# Patient Record
Sex: Male | Born: 1970 | Race: White | Hispanic: No | Marital: Single | Smoking: Former smoker
Health system: Southern US, Community
[De-identification: ages and names within clinical notes are randomized; demographics above are authoritative.]

## PROBLEM LIST (undated history)

## (undated) DIAGNOSIS — N189 Chronic kidney disease, unspecified: Secondary | ICD-10-CM

## (undated) DIAGNOSIS — E119 Type 2 diabetes mellitus without complications: Secondary | ICD-10-CM

## (undated) DIAGNOSIS — I219 Acute myocardial infarction, unspecified: Secondary | ICD-10-CM

## (undated) DIAGNOSIS — I2510 Atherosclerotic heart disease of native coronary artery without angina pectoris: Secondary | ICD-10-CM

## (undated) DIAGNOSIS — I639 Cerebral infarction, unspecified: Secondary | ICD-10-CM

## (undated) DIAGNOSIS — I10 Essential (primary) hypertension: Secondary | ICD-10-CM

## (undated) DIAGNOSIS — E785 Hyperlipidemia, unspecified: Secondary | ICD-10-CM

## (undated) HISTORY — PX: APPENDECTOMY: SHX54

## (undated) HISTORY — DX: Chronic kidney disease, unspecified: N18.9

## (undated) HISTORY — DX: Acute myocardial infarction, unspecified: I21.9

## (undated) HISTORY — PX: HERNIA REPAIR: SHX51

## (undated) HISTORY — DX: Atherosclerotic heart disease of native coronary artery without angina pectoris: I25.10

## (undated) HISTORY — DX: Hyperlipidemia, unspecified: E78.5

---

## 2012-05-17 DIAGNOSIS — F32A Depression, unspecified: Secondary | ICD-10-CM | POA: Insufficient documentation

## 2012-05-17 DIAGNOSIS — J984 Other disorders of lung: Secondary | ICD-10-CM | POA: Insufficient documentation

## 2012-05-17 DIAGNOSIS — F17200 Nicotine dependence, unspecified, uncomplicated: Secondary | ICD-10-CM | POA: Insufficient documentation

## 2013-08-31 ENCOUNTER — Emergency Department: Payer: Self-pay | Admitting: Emergency Medicine

## 2013-08-31 LAB — URINALYSIS, COMPLETE
BLOOD: NEGATIVE
Bacteria: NONE SEEN
Bilirubin,UR: NEGATIVE
Glucose,UR: >=500 mg/dL (ref 0–75)
Ketone: NEGATIVE
Leukocyte Esterase: NEGATIVE
Nitrite: NEGATIVE
PH: 5 (ref 4.5–8.0)
Protein: NEGATIVE
SQUAMOUS EPITHELIAL: NONE SEEN
Specific Gravity: 1.026 (ref 1.003–1.030)

## 2013-08-31 LAB — COMPREHENSIVE METABOLIC PANEL
ALK PHOS: 81 U/L
Albumin: 4.1 g/dL (ref 3.4–5.0)
Anion Gap: 3 — ABNORMAL LOW (ref 7–16)
BUN: 18 mg/dL (ref 7–18)
Bilirubin,Total: 0.2 mg/dL (ref 0.2–1.0)
CHLORIDE: 105 mmol/L (ref 98–107)
CO2: 30 mmol/L (ref 21–32)
Calcium, Total: 9.2 mg/dL (ref 8.5–10.1)
Creatinine: 0.77 mg/dL (ref 0.60–1.30)
EGFR (African American): >60
GLUCOSE: 223 mg/dL — AB (ref 65–99)
Osmolality: 284 (ref 275–301)
POTASSIUM: 4.4 mmol/L (ref 3.5–5.1)
SGOT(AST): 27 U/L (ref 15–37)
SGPT (ALT): 46 U/L (ref 12–78)
SODIUM: 138 mmol/L (ref 136–145)
Total Protein: 7.6 g/dL (ref 6.4–8.2)

## 2013-08-31 LAB — CBC WITH DIFFERENTIAL/PLATELET
BASOS PCT: 1.3 %
Basophil #: 0.1 10*3/uL (ref 0.0–0.1)
EOS ABS: 0.2 10*3/uL (ref 0.0–0.7)
Eosinophil %: 2.1 %
HCT: 44 % (ref 40.0–52.0)
HGB: 14.5 g/dL (ref 13.0–18.0)
Lymphocyte #: 2.5 10*3/uL (ref 1.0–3.6)
Lymphocyte %: 32.3 %
MCH: 30.7 pg (ref 26.0–34.0)
MCHC: 33.1 g/dL (ref 32.0–36.0)
MCV: 93 fL (ref 80–100)
MONO ABS: 0.5 x10 3/mm (ref 0.2–1.0)
MONOS PCT: 5.9 %
Neutrophil #: 4.6 10*3/uL (ref 1.4–6.5)
Neutrophil %: 58.4 %
PLATELETS: 196 10*3/uL (ref 150–440)
RBC: 4.73 10*6/uL (ref 4.40–5.90)
RDW: 13.3 % (ref 11.5–14.5)
WBC: 7.9 10*3/uL (ref 3.8–10.6)

## 2013-08-31 LAB — LIPASE, BLOOD: Lipase: 162 U/L (ref 73–393)

## 2014-10-22 DIAGNOSIS — I10 Essential (primary) hypertension: Secondary | ICD-10-CM | POA: Insufficient documentation

## 2014-10-22 DIAGNOSIS — E7800 Pure hypercholesterolemia, unspecified: Secondary | ICD-10-CM | POA: Insufficient documentation

## 2014-10-22 DIAGNOSIS — Z8673 Personal history of transient ischemic attack (TIA), and cerebral infarction without residual deficits: Secondary | ICD-10-CM | POA: Insufficient documentation

## 2015-04-30 ENCOUNTER — Other Ambulatory Visit: Payer: Self-pay | Admitting: Neurology

## 2015-04-30 DIAGNOSIS — M79602 Pain in left arm: Secondary | ICD-10-CM

## 2015-04-30 DIAGNOSIS — Z8673 Personal history of transient ischemic attack (TIA), and cerebral infarction without residual deficits: Secondary | ICD-10-CM

## 2015-04-30 DIAGNOSIS — M542 Cervicalgia: Secondary | ICD-10-CM

## 2015-05-17 ENCOUNTER — Ambulatory Visit
Admission: RE | Admit: 2015-05-17 | Discharge: 2015-05-17 | Disposition: A | Discharge status: Home / Self Care | Payer: BLUE CROSS/BLUE SHIELD | Source: Ambulatory Visit | Attending: Neurology | Admitting: Neurology

## 2015-05-17 DIAGNOSIS — M50323 Other cervical disc degeneration at C6-C7 level: Secondary | ICD-10-CM | POA: Diagnosis not present

## 2015-05-17 DIAGNOSIS — M542 Cervicalgia: Secondary | ICD-10-CM

## 2015-05-17 DIAGNOSIS — M79602 Pain in left arm: Secondary | ICD-10-CM | POA: Insufficient documentation

## 2015-05-17 DIAGNOSIS — M4802 Spinal stenosis, cervical region: Secondary | ICD-10-CM | POA: Insufficient documentation

## 2015-05-17 DIAGNOSIS — Z8673 Personal history of transient ischemic attack (TIA), and cerebral infarction without residual deficits: Secondary | ICD-10-CM | POA: Diagnosis present

## 2015-05-17 MED ORDER — GADOBENATE DIMEGLUMINE 529 MG/ML IV SOLN
20.0000 mL | Freq: Once | INTRAVENOUS | Status: AC | PRN
  Administered 2015-05-17: 17 mL via INTRAVENOUS

## 2015-12-24 ENCOUNTER — Emergency Department
Admission: EM | Admit: 2015-12-24 | Discharge: 2015-12-24 | Disposition: A | Discharge status: Home / Self Care | Payer: BLUE CROSS/BLUE SHIELD | Attending: Emergency Medicine | Admitting: Emergency Medicine

## 2015-12-24 ENCOUNTER — Emergency Department: Payer: BLUE CROSS/BLUE SHIELD

## 2015-12-24 DIAGNOSIS — S20212A Contusion of left front wall of thorax, initial encounter: Secondary | ICD-10-CM | POA: Insufficient documentation

## 2015-12-24 DIAGNOSIS — I10 Essential (primary) hypertension: Secondary | ICD-10-CM | POA: Diagnosis not present

## 2015-12-24 DIAGNOSIS — Y939 Activity, unspecified: Secondary | ICD-10-CM | POA: Insufficient documentation

## 2015-12-24 DIAGNOSIS — E119 Type 2 diabetes mellitus without complications: Secondary | ICD-10-CM | POA: Insufficient documentation

## 2015-12-24 DIAGNOSIS — Z7984 Long term (current) use of oral hypoglycemic drugs: Secondary | ICD-10-CM | POA: Diagnosis not present

## 2015-12-24 DIAGNOSIS — X501XXA Overexertion from prolonged static or awkward postures, initial encounter: Secondary | ICD-10-CM | POA: Insufficient documentation

## 2015-12-24 DIAGNOSIS — F17210 Nicotine dependence, cigarettes, uncomplicated: Secondary | ICD-10-CM | POA: Diagnosis not present

## 2015-12-24 DIAGNOSIS — Y929 Unspecified place or not applicable: Secondary | ICD-10-CM | POA: Insufficient documentation

## 2015-12-24 DIAGNOSIS — S299XXA Unspecified injury of thorax, initial encounter: Secondary | ICD-10-CM | POA: Diagnosis present

## 2015-12-24 DIAGNOSIS — Z79899 Other long term (current) drug therapy: Secondary | ICD-10-CM | POA: Insufficient documentation

## 2015-12-24 DIAGNOSIS — Z8673 Personal history of transient ischemic attack (TIA), and cerebral infarction without residual deficits: Secondary | ICD-10-CM | POA: Diagnosis not present

## 2015-12-24 DIAGNOSIS — Y999 Unspecified external cause status: Secondary | ICD-10-CM | POA: Insufficient documentation

## 2015-12-24 HISTORY — DX: Cerebral infarction, unspecified: I63.9

## 2015-12-24 HISTORY — DX: Essential (primary) hypertension: I10

## 2015-12-24 HISTORY — DX: Type 2 diabetes mellitus without complications: E11.9

## 2015-12-24 MED ORDER — IBUPROFEN 600 MG PO TABS: 600.0000 mg | ORAL_TABLET | Freq: Three times a day (TID) | ORAL | 0 refills | Status: DC | PRN

## 2015-12-24 MED ORDER — TRAMADOL HCL 50 MG PO TABS: 50.0000 mg | ORAL_TABLET | Freq: Four times a day (QID) | ORAL | 0 refills | Status: AC | PRN

## 2015-12-24 MED ORDER — METHOCARBAMOL 750 MG PO TABS: 750.0000 mg | ORAL_TABLET | Freq: Four times a day (QID) | ORAL | 0 refills | Status: DC

## 2015-12-24 MED ORDER — KETOROLAC TROMETHAMINE 60 MG/2ML IM SOLN
60.0000 mg | Freq: Once | INTRAMUSCULAR | Status: AC
  Administered 2015-12-24: 60 mg via INTRAMUSCULAR
  Filled 2015-12-24: qty 2
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
## 2017-06-02 ENCOUNTER — Other Ambulatory Visit: Payer: Self-pay

## 2017-06-02 ENCOUNTER — Emergency Department: Payer: BLUE CROSS/BLUE SHIELD

## 2017-06-02 ENCOUNTER — Encounter: Payer: Self-pay | Admitting: Emergency Medicine

## 2017-06-02 ENCOUNTER — Inpatient Hospital Stay
Admission: EM | Admit: 2017-06-02 | Discharge: 2017-06-03 | DRG: 247 | Disposition: A | Discharge status: Home / Self Care | Payer: BLUE CROSS/BLUE SHIELD | Attending: Internal Medicine | Admitting: Internal Medicine

## 2017-06-02 ENCOUNTER — Encounter
Admission: EM | Disposition: A | Discharge status: Home / Self Care | Payer: Self-pay | Source: Home / Self Care | Attending: Internal Medicine

## 2017-06-02 DIAGNOSIS — Z79899 Other long term (current) drug therapy: Secondary | ICD-10-CM

## 2017-06-02 DIAGNOSIS — Z7902 Long term (current) use of antithrombotics/antiplatelets: Secondary | ICD-10-CM

## 2017-06-02 DIAGNOSIS — Z7984 Long term (current) use of oral hypoglycemic drugs: Secondary | ICD-10-CM | POA: Diagnosis not present

## 2017-06-02 DIAGNOSIS — Z8673 Personal history of transient ischemic attack (TIA), and cerebral infarction without residual deficits: Secondary | ICD-10-CM | POA: Diagnosis not present

## 2017-06-02 DIAGNOSIS — Z791 Long term (current) use of non-steroidal anti-inflammatories (NSAID): Secondary | ICD-10-CM | POA: Diagnosis not present

## 2017-06-02 DIAGNOSIS — Z7982 Long term (current) use of aspirin: Secondary | ICD-10-CM | POA: Diagnosis not present

## 2017-06-02 DIAGNOSIS — E119 Type 2 diabetes mellitus without complications: Secondary | ICD-10-CM | POA: Diagnosis present

## 2017-06-02 DIAGNOSIS — Z87891 Personal history of nicotine dependence: Secondary | ICD-10-CM

## 2017-06-02 DIAGNOSIS — I214 Non-ST elevation (NSTEMI) myocardial infarction: Secondary | ICD-10-CM | POA: Diagnosis present

## 2017-06-02 DIAGNOSIS — I10 Essential (primary) hypertension: Secondary | ICD-10-CM | POA: Diagnosis present

## 2017-06-02 DIAGNOSIS — E785 Hyperlipidemia, unspecified: Secondary | ICD-10-CM | POA: Diagnosis present

## 2017-06-02 DIAGNOSIS — Z888 Allergy status to other drugs, medicaments and biological substances status: Secondary | ICD-10-CM

## 2017-06-02 HISTORY — PX: CORONARY STENT INTERVENTION: CATH118234

## 2017-06-02 HISTORY — PX: LEFT HEART CATH AND CORONARY ANGIOGRAPHY: CATH118249

## 2017-06-02 LAB — GLUCOSE, CAPILLARY
GLUCOSE-CAPILLARY: 211 mg/dL — AB (ref 65–99)
Glucose-Capillary: 195 mg/dL — ABNORMAL HIGH (ref 65–99)
Glucose-Capillary: 161 mg/dL — ABNORMAL HIGH (ref 65–99)
Glucose-Capillary: 264 mg/dL — ABNORMAL HIGH (ref 65–99)

## 2017-06-02 LAB — CBC
HCT: 44.9 % (ref 40.0–52.0)
Hemoglobin: 15 g/dL (ref 13.0–18.0)
MCH: 30.6 pg (ref 26.0–34.0)
MCHC: 33.3 g/dL (ref 32.0–36.0)
MCV: 91.7 fL (ref 80.0–100.0)
PLATELETS: 185 10*3/uL (ref 150–440)
RBC: 4.9 MIL/uL (ref 4.40–5.90)
RDW: 13.1 % (ref 11.5–14.5)
WBC: 7.4 10*3/uL (ref 3.8–10.6)

## 2017-06-02 LAB — BASIC METABOLIC PANEL
Anion gap: 11 (ref 5–15)
BUN: 19 mg/dL (ref 6–20)
CALCIUM: 9.7 mg/dL (ref 8.9–10.3)
CHLORIDE: 102 mmol/L (ref 101–111)
CO2: 24 mmol/L (ref 22–32)
CREATININE: 0.7 mg/dL (ref 0.61–1.24)
Glucose, Bld: 255 mg/dL — ABNORMAL HIGH (ref 65–99)
Potassium: 4.5 mmol/L (ref 3.5–5.1)
SODIUM: 137 mmol/L (ref 135–145)

## 2017-06-02 LAB — CARDIAC CATHETERIZATION: Cath EF Quantitative: 50 %

## 2017-06-02 LAB — TROPONIN I
TROPONIN I: 5.33 ng/mL — AB (ref <0.03)
Troponin I: 1.48 ng/mL (ref <0.03)

## 2017-06-02 LAB — POCT ACTIVATED CLOTTING TIME: ACTIVATED CLOTTING TIME: 346 s

## 2017-06-02 LAB — APTT: aPTT: 43 seconds — ABNORMAL HIGH (ref 24–36)

## 2017-06-02 SURGERY — LEFT HEART CATH AND CORONARY ANGIOGRAPHY
Anesthesia: Moderate Sedation

## 2017-06-02 MED ORDER — INSULIN ASPART 100 UNIT/ML ~~LOC~~ SOLN
0.0000 [IU] | Freq: Three times a day (TID) | SUBCUTANEOUS | Status: DC
  Administered 2017-06-02: 5 [IU] via SUBCUTANEOUS
  Administered 2017-06-03: 8 [IU] via SUBCUTANEOUS
  Filled 2017-06-02 (×2): qty 1

## 2017-06-02 MED ORDER — ACETAMINOPHEN 325 MG PO TABS: 650.0000 mg | ORAL_TABLET | ORAL | Status: DC | PRN

## 2017-06-02 MED ORDER — ACETAMINOPHEN 650 MG RE SUPP
650.0000 mg | Freq: Four times a day (QID) | RECTAL | Status: DC | PRN
Start: 2017-06-02 — End: 2017-06-03

## 2017-06-02 MED ORDER — HYDRALAZINE HCL 20 MG/ML IJ SOLN: 5.0000 mg | INTRAMUSCULAR | Status: AC | PRN

## 2017-06-02 MED ORDER — SODIUM CHLORIDE 0.9 % IV SOLN: INTRAVENOUS | Status: DC

## 2017-06-02 MED ORDER — ASPIRIN 81 MG PO CHEW
81.0000 mg | CHEWABLE_TABLET | Freq: Every day | ORAL | Status: DC
  Administered 2017-06-02 – 2017-06-03 (×2): 81 mg via ORAL
  Filled 2017-06-02 (×2): qty 1

## 2017-06-02 MED ORDER — CLOPIDOGREL BISULFATE 75 MG PO TABS
75.0000 mg | ORAL_TABLET | Freq: Every day | ORAL | Status: DC
  Administered 2017-06-03: 75 mg via ORAL
  Filled 2017-06-02: qty 1

## 2017-06-02 MED ORDER — MIDAZOLAM HCL 2 MG/2ML IJ SOLN
INTRAMUSCULAR | Status: AC
  Filled 2017-06-02: qty 2

## 2017-06-02 MED ORDER — ASPIRIN 81 MG PO CHEW
243.0000 mg | CHEWABLE_TABLET | Freq: Once | ORAL | Status: AC
  Administered 2017-06-02: 243 mg via ORAL
  Filled 2017-06-02: qty 3

## 2017-06-02 MED ORDER — SODIUM CHLORIDE 0.9 % WEIGHT BASED INFUSION: 1.0000 mL/kg/h | INTRAVENOUS | Status: DC

## 2017-06-02 MED ORDER — ONDANSETRON HCL 4 MG PO TABS: 4.0000 mg | ORAL_TABLET | Freq: Four times a day (QID) | ORAL | Status: DC | PRN

## 2017-06-02 MED ORDER — CLOPIDOGREL BISULFATE 75 MG PO TABS
ORAL_TABLET | ORAL | Status: DC | PRN
  Administered 2017-06-02: 300 mg via ORAL

## 2017-06-02 MED ORDER — METOPROLOL TARTRATE 25 MG PO TABS
25.0000 mg | ORAL_TABLET | Freq: Two times a day (BID) | ORAL | Status: DC
  Administered 2017-06-02 – 2017-06-03 (×3): 25 mg via ORAL
  Filled 2017-06-02 (×2): qty 1

## 2017-06-02 MED ORDER — FENTANYL CITRATE (PF) 100 MCG/2ML IJ SOLN
INTRAMUSCULAR | Status: AC
Start: 2017-06-02 — End: ?
  Filled 2017-06-02: qty 2

## 2017-06-02 MED ORDER — FENTANYL CITRATE (PF) 100 MCG/2ML IJ SOLN
INTRAMUSCULAR | Status: DC | PRN
  Administered 2017-06-02: 25 ug via INTRAVENOUS

## 2017-06-02 MED ORDER — DOCUSATE SODIUM 100 MG PO CAPS
100.0000 mg | ORAL_CAPSULE | Freq: Two times a day (BID) | ORAL | Status: DC
  Administered 2017-06-02 – 2017-06-03 (×2): 100 mg via ORAL
  Filled 2017-06-02 (×2): qty 1

## 2017-06-02 MED ORDER — NITROGLYCERIN 2 % TD OINT: 0.5000 [in_us] | TOPICAL_OINTMENT | Freq: Four times a day (QID) | TRANSDERMAL | Status: DC

## 2017-06-02 MED ORDER — SODIUM CHLORIDE 0.9 % IV SOLN
0.2500 mg/kg/h | INTRAVENOUS | Status: DC
  Filled 2017-06-02: qty 250

## 2017-06-02 MED ORDER — SODIUM CHLORIDE 0.9% FLUSH: 3.0000 mL | Freq: Two times a day (BID) | INTRAVENOUS | Status: DC

## 2017-06-02 MED ORDER — OXYCODONE-ACETAMINOPHEN 5-325 MG PO TABS: 1.0000 | ORAL_TABLET | ORAL | Status: DC | PRN

## 2017-06-02 MED ORDER — SODIUM CHLORIDE 0.9 % WEIGHT BASED INFUSION: 3.0000 mL/kg/h | INTRAVENOUS | Status: AC

## 2017-06-02 MED ORDER — METOPROLOL TARTRATE 25 MG PO TABS
ORAL_TABLET | ORAL | Status: AC
  Administered 2017-06-02: 25 mg via ORAL
  Filled 2017-06-02: qty 1

## 2017-06-02 MED ORDER — NITROGLYCERIN 5 MG/ML IV SOLN
INTRAVENOUS | Status: AC
  Filled 2017-06-02: qty 10

## 2017-06-02 MED ORDER — NITROGLYCERIN 2 % TD OINT
1.0000 [in_us] | TOPICAL_OINTMENT | Freq: Once | TRANSDERMAL | Status: AC
  Administered 2017-06-02: 1 [in_us] via TOPICAL

## 2017-06-02 MED ORDER — ASPIRIN EC 81 MG PO TBEC: 81.0000 mg | DELAYED_RELEASE_TABLET | Freq: Every day | ORAL | Status: DC

## 2017-06-02 MED ORDER — BIVALIRUDIN TRIFLUOROACETATE 250 MG IV SOLR
INTRAVENOUS | Status: AC
  Filled 2017-06-02: qty 250

## 2017-06-02 MED ORDER — LOSARTAN POTASSIUM 25 MG PO TABS
25.0000 mg | ORAL_TABLET | Freq: Every day | ORAL | Status: DC
  Administered 2017-06-03: 25 mg via ORAL
  Filled 2017-06-02: qty 1

## 2017-06-02 MED ORDER — LABETALOL HCL 5 MG/ML IV SOLN: 10.0000 mg | INTRAVENOUS | Status: AC | PRN

## 2017-06-02 MED ORDER — NITROGLYCERIN 2 % TD OINT
TOPICAL_OINTMENT | TRANSDERMAL | Status: AC
  Administered 2017-06-02: 1 [in_us] via TOPICAL
  Filled 2017-06-02: qty 1

## 2017-06-02 MED ORDER — SODIUM CHLORIDE 0.9% FLUSH
3.0000 mL | Freq: Two times a day (BID) | INTRAVENOUS | Status: DC
  Administered 2017-06-02 – 2017-06-03 (×2): 3 mL via INTRAVENOUS

## 2017-06-02 MED ORDER — BIVALIRUDIN BOLUS VIA INFUSION - CUPID
INTRAVENOUS | Status: DC | PRN
  Administered 2017-06-02: 63.3 mg via INTRAVENOUS

## 2017-06-02 MED ORDER — SODIUM CHLORIDE 0.9% FLUSH: 3.0000 mL | INTRAVENOUS | Status: DC | PRN

## 2017-06-02 MED ORDER — ASPIRIN 81 MG PO CHEW: 81.0000 mg | CHEWABLE_TABLET | ORAL | Status: DC

## 2017-06-02 MED ORDER — SODIUM CHLORIDE 0.9% FLUSH
3.0000 mL | INTRAVENOUS | Status: DC | PRN
Start: 2017-06-02 — End: 2017-06-02

## 2017-06-02 MED ORDER — IOPAMIDOL (ISOVUE-300) INJECTION 61%
INTRAVENOUS | Status: DC | PRN
  Administered 2017-06-02: 215 mL via INTRA_ARTERIAL

## 2017-06-02 MED ORDER — ONDANSETRON HCL 4 MG/2ML IJ SOLN: 4.0000 mg | Freq: Four times a day (QID) | INTRAMUSCULAR | Status: DC | PRN

## 2017-06-02 MED ORDER — CLOPIDOGREL BISULFATE 300 MG PO TABS
ORAL_TABLET | ORAL | Status: AC
  Filled 2017-06-02: qty 1

## 2017-06-02 MED ORDER — ACETAMINOPHEN 325 MG PO TABS
650.0000 mg | ORAL_TABLET | Freq: Four times a day (QID) | ORAL | Status: DC | PRN
  Administered 2017-06-02: 650 mg via ORAL
  Filled 2017-06-02: qty 2

## 2017-06-02 MED ORDER — GABAPENTIN 300 MG PO CAPS
300.0000 mg | ORAL_CAPSULE | Freq: Three times a day (TID) | ORAL | Status: DC
  Administered 2017-06-02 – 2017-06-03 (×3): 300 mg via ORAL
  Filled 2017-06-02 (×3): qty 1

## 2017-06-02 MED ORDER — SODIUM CHLORIDE 0.9 % IV SOLN
INTRAVENOUS | Status: AC | PRN
  Administered 2017-06-02: 1.75 mg/kg/h via INTRAVENOUS

## 2017-06-02 MED ORDER — MIDAZOLAM HCL 2 MG/2ML IJ SOLN
INTRAMUSCULAR | Status: DC | PRN
  Administered 2017-06-02: 1 mg via INTRAVENOUS

## 2017-06-02 MED ORDER — BISACODYL 5 MG PO TBEC: 5.0000 mg | DELAYED_RELEASE_TABLET | Freq: Every day | ORAL | Status: DC | PRN

## 2017-06-02 MED ORDER — HEPARIN BOLUS VIA INFUSION
4000.0000 [IU] | Freq: Once | INTRAVENOUS | Status: AC
  Administered 2017-06-02: 4000 [IU] via INTRAVENOUS
  Filled 2017-06-02: qty 4000

## 2017-06-02 MED ORDER — SODIUM CHLORIDE 0.9 % IV SOLN
250.0000 mL | INTRAVENOUS | Status: DC | PRN
Start: 2017-06-02 — End: 2017-06-02

## 2017-06-02 MED ORDER — SODIUM CHLORIDE 0.9 % WEIGHT BASED INFUSION
1.0000 mL/kg/h | INTRAVENOUS | Status: DC
  Administered 2017-06-02: 1 mL/kg/h via INTRAVENOUS

## 2017-06-02 MED ORDER — SODIUM CHLORIDE 0.9 % IV SOLN: 250.0000 mL | INTRAVENOUS | Status: DC | PRN

## 2017-06-02 MED ORDER — SIMVASTATIN 20 MG PO TABS
20.0000 mg | ORAL_TABLET | Freq: Every day | ORAL | Status: DC
  Administered 2017-06-02: 20 mg via ORAL
  Filled 2017-06-02: qty 1

## 2017-06-02 MED ORDER — HEPARIN (PORCINE) IN NACL 100-0.45 UNIT/ML-% IJ SOLN
1000.0000 [IU]/h | INTRAMUSCULAR | Status: DC
  Administered 2017-06-02: 1000 [IU]/h via INTRAVENOUS
  Filled 2017-06-02: qty 250

## 2017-06-02 SURGICAL SUPPLY — 18 items
BALLN TREK RX 3.0X12 (BALLOONS) ×3
BALLN ~~LOC~~ TREK RX 3.25X12 (BALLOONS) ×3
BALLOON TREK RX 3.0X12 (BALLOONS) ×1 IMPLANT
BALLOON ~~LOC~~ TREK RX 3.25X12 (BALLOONS) ×1 IMPLANT
CATH INFINITI 5FR ANG PIGTAIL (CATHETERS) ×3 IMPLANT
CATH INFINITI 5FR JL4 (CATHETERS) ×3 IMPLANT
CATH INFINITI JR4 5F (CATHETERS) ×3 IMPLANT
CATH VISTA GUIDE 6FR JR4 SH (CATHETERS) ×3 IMPLANT
DEVICE CLOSURE MYNXGRIP 6/7F (Vascular Products) ×3 IMPLANT
DEVICE INFLAT 30 PLUS (MISCELLANEOUS) ×3 IMPLANT
KIT MANI 3VAL PERCEP (MISCELLANEOUS) ×3 IMPLANT
NEEDLE PERC 18GX7CM (NEEDLE) ×3 IMPLANT
PACK CARDIAC CATH (CUSTOM PROCEDURE TRAY) ×3 IMPLANT
SHEATH AVANTI 5FR X 11CM (SHEATH) ×3 IMPLANT
SHEATH AVANTI 6FR X 11CM (SHEATH) ×3 IMPLANT
STENT SIERRA 3.00 X 18 MM (Permanent Stent) ×3 IMPLANT
WIRE EMERALD 3MM-J .035X150CM (WIRE) ×3 IMPLANT
WIRE G HI TQ BMW 190 (WIRE) ×3 IMPLANT

## 2017-06-02 NOTE — ED Provider Notes (Signed)
[facility] Emergency Department Provider Note ____________________________________________   I have reviewed the triage vital signs and the triage nursing note.  HISTORY  Chief Complaint Chest Pain   Historian Patient  HPI Peter Galloway is a 47 y.o. male with a history of prior hypertension and stroke and diabetes, on metformin, presents to the ED for "indigestion" which is been intermittent but mostly ongoing since at least Friday, approximately 5 days now.  States nausea with some vomiting, nonbloody nonbilious especially on Saturday.  No shortness of breath.  Occasionally he has pain that goes to both arms.  Currently he is not having any significant chest discomfort, but he states he has some soreness in both arms.  No fever or coughing.  At times symptoms were very severe, he states that he is stubborn and asked why he did not come initially or any of the last 4 days.   Past Medical History:  Diagnosis Date  . Diabetes mellitus without complication (HCC)   . Hypertension   . Stroke [facility](HCC)     There are no active problems to display for this patient.   Past Surgical History:  Procedure Laterality Date  . APPENDECTOMY    . HERNIA REPAIR      Prior to Admission medications   Medication Sig Start Date End Date Taking? Authorizing Provider  clopidogrel (PLAVIX) 75 MG tablet Take 75 mg by mouth daily.    [provider]  glipiZIDE (GLUCOTROL) 10 MG tablet Take 10 mg by mouth daily before breakfast.    [provider]  ibuprofen (ADVIL,MOTRIN) 600 MG tablet Take 1 tablet (600 mg total) by mouth every 8 (eight) hours as needed. 12/24/15   Joni ReiningSmith, Ronald K, PA-C  losartan (COZAAR) 25 MG tablet Take 25 mg by mouth daily.    [provider]  metFORMIN (GLUCOPHAGE) 500 MG tablet Take 500 mg by mouth 2 (two) times daily with a meal.    [provider]  methocarbamol (ROBAXIN-750) 750 MG tablet Take 1 tablet (750 mg total) by  mouth 4 (four) times daily. 12/24/15   Joni ReiningSmith, Ronald K, PA-C  simvastatin (ZOCOR) 20 MG tablet Take 20 mg by mouth daily.    [provider]    No Known Allergies  History reviewed. No pertinent family history.  Social History Social History   Tobacco Use  . Smoking status: Former Smoker    Types: Cigarettes  . Smokeless tobacco: Never Used  Substance Use Topics  . Alcohol use: No  . Drug use: No    Review of Systems  Constitutional: Negative for fever. Eyes: Negative for visual changes. ENT: Negative for sore throat. Cardiovascular: Positive for chest pain. Respiratory: Negative for shortness of breath. Gastrointestinal: Negative for abdominal pain, vomiting and diarrhea. Genitourinary: Negative for dysuria. Musculoskeletal: Negative for back pain. Skin: Negative for rash. Neurological: Negative for headache.  ____________________________________________   PHYSICAL EXAM:  VITAL SIGNS: ED Triage Vitals  Enc Vitals Group     BP 06/02/17 0943 (!) 168/100     Pulse Rate 06/02/17 0943 95     Resp 06/02/17 0943 18     Temp 06/02/17 0943 98.2 F (36.8 C)     Temp Source 06/02/17 0943 Oral     SpO2 06/02/17 0943 98 %     Weight 06/02/17 0943 186 lb (84.4 kg)     Height 06/02/17 0943 5\' 10"  (1.778 m)     Head Circumference --      Peak Flow --  Pain Score 06/02/17 0947 7     Pain Loc --      Pain Edu? --      Excl. in GC? --      Constitutional: Alert and oriented. Well appearing and in no distress. HEENT   Head: Normocephalic and atraumatic.      Eyes: Conjunctivae are normal. Pupils equal and round.       Ears:         Nose: No congestion/rhinnorhea.   Mouth/Throat: Mucous membranes are moist.   Neck: No stridor. Cardiovascular/Chest: Normal rate, regular rhythm.  No murmurs, rubs, or gallops. Respiratory: Normal respiratory effort without tachypnea nor retractions. Breath sounds are clear and equal bilaterally. No  wheezes/rales/rhonchi. Gastrointestinal: Soft. No distention, no guarding, no rebound. Nontender.    Genitourinary/rectal:Deferred Musculoskeletal: Nontender with normal range of motion in all extremities. No joint effusions.  No lower extremity tenderness.  No edema. Neurologic:  Normal speech and language. No gross or focal neurologic deficits are appreciated. Skin:  Skin is warm, dry and intact. No rash noted. Psychiatric: Mood and affect are normal. Speech and behavior are normal. Patient exhibits appropriate insight and judgment.   ____________________________________________  LABS (pertinent positives/negatives) I, Governor Rooks, MD the attending physician have reviewed the labs noted below.  Labs Reviewed  BASIC METABOLIC PANEL - Abnormal; Notable for the following components:      Result Value   Glucose, Bld 255 (*)    All other components within normal limits  TROPONIN I - Abnormal; Notable for the following components:   Troponin I 1.48 (*)    All other components within normal limits  CBC    ____________________________________________    EKG I, Governor Rooks, MD, the attending physician have personally viewed and interpreted all ECGs.  95 bpm.  Narrow QRS.  Normal axis.  T waves inverted inferiorly. ____________________________________________  RADIOLOGY All Xrays were viewed by me.  Imaging interpreted by Radiologist, and I, Governor Rooks, MD the attending physician have reviewed the radiologist interpretation noted below.  Chest x-ray two-view:  IMPRESSION: No active cardiopulmonary disease. __________________________________________  PROCEDURES  Procedure(s) performed: None  Critical Care performed: None   ____________________________________________  ED COURSE / ASSESSMENT AND PLAN  Pertinent labs & imaging results that were available during my care of the patient were reviewed by me and considered in my medical decision making (see chart for  details).    Patient brought back to room after labs called to nurse reporting elevated troponin.  I reviewed patient's EKG, nonspecific, possibly ischemic T wave inversions inferiorly without any criteria for acute STEMI alert.  Patient describes stuttering indigestion type chest pain that extends into his arms for the past 4-5 days.  Currently it is really is mild as it has been, but at times it severe.  We discussed that his troponin is elevated indicating an STEMI.  Patient will be placed on 3 additional aspirins as he took 82 mg aspirin at home, as well as heparin.  He has some minor discomfort into his arms and could not place nitroglycerin paste with elevated blood pressures as well.  DIFFERENTIAL DIAGNOSIS: Differential diagnosis includes, but is not limited to, ACS, aortic dissection, pulmonary embolism, cardiac tamponade, pneumothorax, pneumonia, pericarditis, myocarditis, GI-related causes including esophagitis/gastritis, and musculoskeletal chest wall pain.    CONSULTATIONS: Hospitalist for admission.   Patient / Family / Caregiver informed of clinical course, medical decision-making process, and agree with plan.    ___________________________________________   FINAL CLINICAL IMPRESSION(S) / ED DIAGNOSES  Final diagnoses:  NSTEMI (non-ST elevation myocardial infarction) (HCC)      ___________________________________________        Note: This dictation was prepared with Dragon dictation. Any transcriptional errors that result from this process are unintentional    Governor Rooks, MD 06/02/17 1210

## 2017-06-02 NOTE — Care Management Note (Signed)
Case Management Note  Patient Details  Name: Peter GuilesKenneth Galloway MRN: [id number] Date of Birth: 02/05/1971  Subjective/Objective:                 Patient admitted with nstemi and for cardiac cath.  No previous cardiac history.  PCP Cherie OuchM Anderson.  Previous dx hypertension, diabetes and cva. Independent in his adls.  Action/Plan:  Anticipate possible need for cost prohibitive anti platelet depending on results of cardiac cath  Expected Discharge Date:                  Expected Discharge Plan:     In-House Referral:     Discharge planning Services     Post Acute Care Choice:    Choice offered to:     DME Arranged:    DME Agency:     HH Arranged:    HH Agency:     Status of Service:     If discussed at [employer] Meetings, dates discussed:    Additional Comments:  Eber HongGreene, Parag Dorton R, RN 06/02/2017, 1:38 PM

## 2017-06-02 NOTE — ED Notes (Signed)
Dr. Nydia Boutonalwood at bedside requesting another EKG. EKG completed by this RN. Unable to print EKG at this time but it has been exported and is available in the computer. Admitted MD notified as well as Nehemiah SettleBrooke, Charity fundraiserN in specials.

## 2017-06-02 NOTE — Plan of Care (Signed)
  Clinical Measurements: Ability to maintain clinical measurements within normal limits will improve 06/02/2017 2256 - Progressing by Kalman JewelsBallentine, Auriana Scalia, RN   Cardiovascular: Ability to achieve and maintain adequate cardiovascular perfusion will improve 06/02/2017 2256 - Progressing by Kalman JewelsBallentine, Glenetta Kiger, RN   Cardiovascular: Vascular access site(s) Level 0-1 will be maintained 06/02/2017 2256 - Progressing by Kalman JewelsBallentine, Adison Reifsteck, RN

## 2017-06-02 NOTE — ED Notes (Addendum)
Patient taken to specials recovery room 2 in gown on zoll monitor by this RN. Belongings sent with patient.

## 2017-06-02 NOTE — Progress Notes (Signed)
ANTICOAGULATION CONSULT NOTE - Initial Consult  Pharmacy Consult for heparin gtt Indication: chest pain/ACS  Allergies  Allergen Reactions  . Lisinopril Cough    Other reaction(s): OTHER     Patient Measurements: Height: 5\' 10"  (177.8 cm) Weight: 186 lb (84.4 kg) IBW/kg (Calculated) : 73 Heparin Dosing Weight: 84.4kg  Vital Signs: Temp: 98.2 F (36.8 C) (01/02 0943) Temp Source: Oral (01/02 0943) BP: 170/115 (01/02 1153) Pulse Rate: 92 (01/02 1153)  Labs: Recent Labs    06/02/17 1001  HGB 15.0  HCT 44.9  PLT 185  CREATININE 0.70  TROPONINI 1.48*    Estimated Creatinine Clearance: 117.9 mL/min (by C-G formula based on SCr of 0.7 mg/dL).   Medical History: Past Medical History:  Diagnosis Date  . Diabetes mellitus without complication (HCC)   . Hypertension   . Stroke [facility](HCC)     Medications:   (Not in a hospital admission) Scheduled:  . aspirin EC  81 mg Oral Daily  . aspirin EC  81 mg Oral Daily  . docusate sodium  100 mg Oral BID  . heparin  4,000 Units Intravenous Once  . insulin aspart  0-15 Units Subcutaneous TID WC  . losartan  25 mg Oral Daily  . metoprolol tartrate  25 mg Oral BID  . nitroGLYCERIN  0.5 inch Topical Q6H  . simvastatin  20 mg Oral QHS   Infusions:  . sodium chloride    . heparin     PRN: acetaminophen **OR** acetaminophen, bisacodyl, ondansetron **OR** ondansetron (ZOFRAN) IV Anti-infectives (From admission, onward)   None      Assessment: 47 year old male, pharmacy consulted for heparin gtt in an ACS/NSTEMI indication.   Goal of Therapy:  Heparin level 0.3-0.7 units/ml Monitor platelets by anticoagulation protocol: Yes   Plan:  Give 4000 units bolus x 1 Start heparin infusion at 1000 units/hr Check anti-Xa level in 6 hours and daily while on heparin Continue to monitor H&H and platelets  Gerre PebblesGarrett Jovanny Stephanie 06/02/2017,12:38 PM

## 2017-06-02 NOTE — H&P (Signed)
[facility] - [place] at [facility]   PATIENT NAME: Peter Galloway    MR#:  [id number]  DATE OF BIRTH:  1970/09/09  DATE OF ADMISSION:  06/02/2017  PRIMARY CARE PHYSICIAN: Lauro Regulus, MD   REQUESTING/REFERRING PHYSICIAN:  Dorothea Glassman  CHIEF COMPLAINT ; chest pain   Chief Complaint  Patient presents with  . Chest Pain    HISTORY OF PRESENT ILLNESS:  Peter Galloway  is a 47 y.o. male with a known history of htn, diabetes type 2, hyperlipidemia came in because of left-sided chest pain.  Patient has been having left-sided chest pain for almost 1 week and he thought it was indigestion.,  Patient chest pain persisted,, currently he got worse 10 out of 10 in severity so he came to ER.  In the ER patient was given Nitropaste with improvement of chest pain and now chest pain is around 3 out of 10 in severity.  Initial EKG does not show any changes.  Cardiology ordered second EKG, patient is now scheduled to have cardiac cath.  He has a positive troponin 1.4.  Patient has strong family history of coronary artery disease, patient father had quadruple bypass.  Chest pain associated with nausea but no dizziness or sweating.  Past Medical History:  Diagnosis Date  . Diabetes mellitus without complication (HCC)   . Hypertension   . Stroke [facility])     PAST SURGICAL HISTOIRY:   Past Surgical History:  Procedure Laterality Date  . APPENDECTOMY    . HERNIA REPAIR      SOCIAL HISTORY:   Social History   Tobacco Use  . Smoking status: Former Smoker    Types: Cigarettes  . Smokeless tobacco: Never Used  Substance Use Topics  . Alcohol use: No    FAMILY HISTORY:  History reviewed. No pertinent family history.  DRUG ALLERGIES:   Allergies  Allergen Reactions  . Lisinopril Cough    Other reaction(s): OTHER     REVIEW OF SYSTEMS:  CONSTITUTIONAL: No fever, fatigue or weakness.  EYES: No blurred or double vision.  EARS, NOSE, AND THROAT: No tinnitus  or ear pain.  RESPIRATORY: No cough, shortness of breath, wheezing or hemoptysis.  CARDIOVASCULAR: Left-sided chest pain, orthopnea, edema.  GASTROINTESTINAL: No pain nausea, vomiting, diarrhea or abdominal pain.  GENITOURINARY: No dysuria, hematuria.  ENDOCRINE: No polyuria, nocturia,  HEMATOLOGY: No anemia, easy bruising or bleeding SKIN: No rash or lesion. MUSCULOSKELETAL: No joint pain or arthritis.   NEUROLOGIC: No tingling, numbness, weakness.  PSYCHIATRY: No anxiety or depression.   MEDICATIONS AT HOME:   Prior to Admission medications   Medication initial EKG done when he came showed Sig Start Date End Date Taking? Authorizing Provider  aspirin EC 81 MG tablet Take 81 mg by mouth daily.   Yes [provider]  gabapentin (NEURONTIN) 300 MG capsule Take 300 mg by mouth 3 (three) times daily as needed. 03/18/17  Yes [provider]  glipiZIDE (GLUCOTROL) 10 MG tablet Take 10 mg by mouth daily before breakfast.   Yes [provider]  losartan (COZAAR) 25 MG tablet Take 25 mg by mouth daily.   Yes [provider]  metFORMIN (GLUCOPHAGE) 500 MG tablet Take 500 mg by mouth 2 (two) times daily with a meal.   Yes [provider]  simvastatin (ZOCOR) 20 MG tablet Take 20 mg by mouth at bedtime.    Yes [provider]  methocarbamol (ROBAXIN-750) 750 MG tablet Take 1 tablet (750 mg  total) by mouth 4 (four) times daily. Patient not taking: Reported on 06/02/2017 12/24/15   Joni ReiningSmith, Ronald K, PA-C      VITAL SIGNS:  Blood pressure (!) 145/97, pulse 88, temperature 98.2 F (36.8 C), temperature source Oral, resp. rate 11, height 5\' 10"  (1.778 m), weight 84.4 kg (186 lb), SpO2 97 %.  PHYSICAL EXAMINATION:  GENERAL:  47 y.o.-year-old patient lying in the bed with no acute distress.  EYES: Pupils equal, round, reactive to light and accommodation. No scleral icterus. Extraocular muscles intact.  HEENT: Head atraumatic, normocephalic. Oropharynx  and nasopharynx clear.  NECK:  Supple, no jugular venous distention. No thyroid enlargement, no tenderness.  LUNGS: Normal breath sounds bilaterally, no wheezing, rales,rhonchi or crepitation. No use of accessory muscles of respiration.  CARDIOVASCULAR: S1, S2 normal. No murmurs, rubs, or gallops.  ABDOMEN: Soft, nontender, nondistended. Bowel sounds present. No organomegaly or mass.  EXTREMITIES: No pedal edema, cyanosis, or clubbing.  NEUROLOGIC: Cranial nerves II through XII are intact. Muscle strength 5/5 in all extremities. Sensation intact. Gait not checked.  PSYCHIATRIC: The patient is alert and oriented x 3.  SKIN: No obvious rash, lesion, or ulcer.   LABORATORY PANEL:   CBC Recent Labs  Lab 06/02/17 1001  WBC 7.4  HGB 15.0  HCT 44.9  PLT 185   ------------------------------------------------------------------------------------------------------------------  Chemistries  Recent Labs  Lab 06/02/17 1001  NA 137  K 4.5  CL 102  CO2 24  GLUCOSE 255*  BUN 19  CREATININE 0.70  CALCIUM 9.7   ------------------------------------------------------------------------------------------------------------------  Cardiac Enzymes Recent Labs  Lab 06/02/17 1001  TROPONINI 1.48*   ------------------------------------------------------------------------------------------------------------------  RADIOLOGY:  Dg Chest 2 View  Result Date: 06/02/2017 CLINICAL DATA:  Chest pain EXAM: CHEST  2 VIEW COMPARISON:  12/24/2015 FINDINGS: The heart size and mediastinal contours are within normal limits. Both lungs are clear. The visualized skeletal structures are unremarkable. IMPRESSION: No active cardiopulmonary disease. Electronically Signed   By: Signa Kellaylor  Stroud M.D.   On: 06/02/2017 10:25    EKG:   Orders placed or performed during the hospital encounter of 06/02/17  . ED EKG within 10 minutes  . ED EKG within 10 minutes  . EKG 12-Lead  . EKG 12-Lead  Initial EKG done at 09, 40   amthis morning showed normal sinus rhythm with T wave inversion in leads V5 and V6 but now repeat EKG done around 1 PM showed pronounced T wave inversions in from lead V3 to V6.  IMPRESSION AND PLAN:   #1 left-sided chest pain with non-ST elevation MI: first Troponin 1.4.  Marland Kitchen.EKG changes concerning for acute coronary syndrome.  Patient is going for cardiac cath at this time, continue aspirin, beta-blockers, nitrates, statins, seen by cardiology Dr. Nolen Muallwoodin the emergency room.   admitted to telemetry, cycle the troponins, patient is going for cardiac catheterization now.     2.Diabetes type 2: Patient is n.p.o. now, continue sliding scale insulin with coverage, hold metformin already started on heparin drip in the emergency room. 48 hours due to contrast.  3.  Hyperlipidemia: Continue statins.   All the records are reviewed and case discussed with ED provider. Management plans discussed with the patient, family and they are in agreement.  CODE STATUS: full code  TOTAL TIME TAKING CARE OF THIS PATIENT: 55 minutes.    Katha HammingSnehalatha Luther Newhouse M.D on 06/02/2017 at 1:16 PM  Between 7am to 6pm - Pager - [phone]  After 6pm go to [email or web address] - password [email or web address] [facility]  [facility]  Office  [phone]  CC: Primary care physician; Lauro Regulus, MD  Note: This dictation was prepared with Dragon dictation along with smaller phrase technology. Any transcriptional errors that result from this process are unintentional.

## 2017-06-02 NOTE — ED Triage Notes (Signed)
Pt to ed with c/o chest pain intermittent since last week. Pt states he initially thought it was indigestion and states he took otc meds without relief.  Pt states took zantac and pepto bismal last night.  States pain in bilat arms and through to his back.

## 2017-06-02 NOTE — Consult Note (Signed)
Reason for Consult:[place]/possible STEMI/Elevated troponin Referring Physician: Dr Ouida Sills primary, Dr Williemae Area is an 47 y.o. male.  HPI: Patient is a 47 year old white male history of multiple medical problems hypertension diabetes previous CVA hyperlipidemia complains of recurrent persistent midsternal chest discomfort with nausea vomiting diaphoresis over the last several days.  Patient has had multiple episodes with severe chest pain waxing and waning he decided not to seek medical attention finally decided to come in today because the pain was persistent and would not go away he had some radiation to his back as well.. Patient denies any previous cardiac history but has had CVAs in the past chest pain is reasonably controlled at this .2/10 but this is been as high as 9 out of 10 he thought was indigestion and took some Maalox and Mylanta without any significant improvement.  EKG today is suggestive of acute coronary syndrome so the patient has subtle elevations inferiorly with deep T waves inferiorly.  Patient denied any palpitations or tachycardia no blackout spells or syncope nausea vomiting is improved.  Patient has remote history of smoking still on diabetes medications still on blood pressure medications but is unable to tolerate ACE inhibitors.  Patient now is in the emergency room with reasonable chest pain abnormal EKG and borderline troponins of 1.5.  Past Medical History:  Diagnosis Date  . Diabetes mellitus without complication ([place])   . Hypertension   . Stroke [facility])     Past Surgical History:  Procedure Laterality Date  . APPENDECTOMY    . HERNIA REPAIR      History reviewed. No pertinent family history.  Social History:  reports that he has quit smoking. His smoking use included cigarettes. he has never used smokeless tobacco. He reports that he does not drink alcohol or use drugs.  Allergies:  Allergies  Allergen Reactions  . Lisinopril Cough    Other  reaction(s): OTHER     Medications: I have reviewed the patient's current medications.  Results for orders placed or performed during the hospital encounter of 06/02/17 (from the past 48 hour(s))  Basic metabolic panel     Status: Abnormal   Collection Time: 06/02/17 10:01 AM  Result Value Ref Range   Sodium 137 135 - 145 mmol/L   Potassium 4.5 3.5 - 5.1 mmol/L   Chloride 102 101 - 111 mmol/L   CO2 24 22 - 32 mmol/L   Glucose, Bld 255 (H) 65 - 99 mg/dL   BUN 19 6 - 20 mg/dL   Creatinine, Ser 0.70 0.61 - 1.24 mg/dL   Calcium 9.7 8.9 - 10.3 mg/dL   GFR calc non Af Amer >60 >60 mL/min   GFR calc Af Amer >60 >60 mL/min    Comment: (NOTE) The eGFR has been calculated using the CKD EPI equation. This calculation has not been validated in all clinical situations. eGFR's persistently <60 mL/min signify possible Chronic Kidney Disease.    Anion gap 11 5 - 15    Comment: Performed at [facility], [address] [place], [place] [address]  CBC     Status: None   Collection Time: 06/02/17 10:01 AM  Result Value Ref Range   WBC 7.4 3.8 - 10.6 K/uL   RBC 4.90 4.40 - 5.90 MIL/uL   Hemoglobin 15.0 13.0 - 18.0 g/dL   HCT 44.9 40.0 - 52.0 %   MCV 91.7 80.0 - 100.0 fL   MCH 30.6 26.0 - 34.0 pg   MCHC 33.3 32.0 - 36.0  g/dL   RDW 13.1 11.5 - 14.5 %   Platelets 185 150 - 440 K/uL    Comment: Performed at [facility], [address] [place], [place] [address]  Troponin I     Status: Abnormal   Collection Time: 06/02/17 10:01 AM  Result Value Ref Range   Troponin I 1.48 (HH) <0.03 ng/mL    Comment: CRITICAL RESULT CALLED TO, READ BACK BY AND VERIFIED WITH [facility] MOFFITT AT 1142 06/02/17 DAS Performed at [facility], [address], [place], [place] [address]     Dg Chest 2 View  Result Date: 06/02/2017 CLINICAL DATA:  Chest pain EXAM: CHEST  2 VIEW COMPARISON:  12/24/2015 FINDINGS: The heart size and mediastinal contours are within normal limits. Both  lungs are clear. The visualized skeletal structures are unremarkable. IMPRESSION: No active cardiopulmonary disease. Electronically Signed   By: Kerby Moors M.D.   On: 06/02/2017 10:25    Review of Systems  Constitutional: Positive for diaphoresis and malaise/fatigue.  HENT: Positive for congestion.   Eyes: Negative.   Respiratory: Positive for shortness of breath.   Cardiovascular: Positive for chest pain, palpitations and orthopnea.  Gastrointestinal: Positive for abdominal pain, heartburn, nausea and vomiting.  Genitourinary: Negative.   Musculoskeletal: Positive for myalgias.  Skin: Negative.   Neurological: Positive for weakness.  Endo/Heme/Allergies: Negative.   Psychiatric/Behavioral: Negative.    Blood pressure (!) 153/98, pulse 94, temperature 97.9 F (36.6 C), temperature source Oral, resp. rate 16, height 5' 10"  (1.778 m), weight 186 lb (84.4 kg), SpO2 (!) 10 %. Physical Exam  Nursing note and vitals reviewed. Constitutional: He is oriented to person, place, and time. He appears well-developed and well-nourished.  HENT:  Head: Normocephalic and atraumatic.  Eyes: Conjunctivae and EOM are normal. Pupils are equal, round, and reactive to light.  Neck: Normal range of motion. Neck supple.  Cardiovascular: Normal rate, regular rhythm and normal heart sounds.  Respiratory: Effort normal and breath sounds normal.  GI: Soft. Bowel sounds are normal.  Musculoskeletal: Normal range of motion.  Neurological: He is alert and oriented to person, place, and time. He has normal reflexes.  Skin: Skin is warm and dry.  Psychiatric: He has a normal mood and affect.    Assessment/Plan: Unstable Angina Abnormal Ekg Chest Pain HTN DM Prior Smoking Possible STEMI Elevation Troponin History of CVA . PLAN Agree with Admission for R/OMI ASA 81 mg QD Heparin IV for anticoug Recommend urgent cardiac cath ECHO for evaluation abnormal Ekg Consider Imdur / NTG   B-Blockers/ACE Continue DM and control Agree with htn control Will proceed with urgent cardiac cath for assessment of coronary disease with intention to intervene if possible       Casmer Yepiz D Giavonni Cizek 06/02/2017, 1:29 PM

## 2017-06-03 ENCOUNTER — Encounter: Payer: Self-pay | Admitting: Internal Medicine

## 2017-06-03 LAB — CBC
HCT: 41 % (ref 40.0–52.0)
Hemoglobin: 14.1 g/dL (ref 13.0–18.0)
MCH: 31.6 pg (ref 26.0–34.0)
MCHC: 34.4 g/dL (ref 32.0–36.0)
MCV: 91.9 fL (ref 80.0–100.0)
PLATELETS: 166 10*3/uL (ref 150–440)
RBC: 4.46 MIL/uL (ref 4.40–5.90)
RDW: 13.2 % (ref 11.5–14.5)
WBC: 7.7 10*3/uL (ref 3.8–10.6)

## 2017-06-03 LAB — GLUCOSE, CAPILLARY
GLUCOSE-CAPILLARY: 280 mg/dL — AB (ref 65–99)
GLUCOSE-CAPILLARY: 250 mg/dL — AB (ref 65–99)

## 2017-06-03 LAB — BASIC METABOLIC PANEL
Anion gap: 7 (ref 5–15)
BUN: 15 mg/dL (ref 6–20)
CALCIUM: 8.9 mg/dL (ref 8.9–10.3)
CHLORIDE: 105 mmol/L (ref 101–111)
CO2: 24 mmol/L (ref 22–32)
CREATININE: 0.71 mg/dL (ref 0.61–1.24)
GFR calc non Af Amer: >60 mL/min (ref >60)
Glucose, Bld: 262 mg/dL — ABNORMAL HIGH (ref 65–99)
Potassium: 4.3 mmol/L (ref 3.5–5.1)
Sodium: 136 mmol/L (ref 135–145)

## 2017-06-03 LAB — TROPONIN I
Troponin I: 2.48 ng/mL (ref <0.03)
Troponin I: 2.75 ng/mL (ref <0.03)

## 2017-06-03 LAB — HIV ANTIBODY (ROUTINE TESTING W REFLEX): HIV SCREEN 4TH GENERATION: NONREACTIVE

## 2017-06-03 MED ORDER — CLOPIDOGREL BISULFATE 75 MG PO TABS: 75.0000 mg | ORAL_TABLET | Freq: Every day | ORAL | 3 refills | Status: AC

## 2017-06-03 MED ORDER — METOPROLOL TARTRATE 25 MG PO TABS: 25.0000 mg | ORAL_TABLET | Freq: Two times a day (BID) | ORAL | 1 refills | Status: DC

## 2017-06-03 MED ORDER — METFORMIN HCL 500 MG PO TABS: 500.0000 mg | ORAL_TABLET | Freq: Two times a day (BID) | ORAL | 0 refills | Status: DC

## 2017-06-03 MED ORDER — SIMVASTATIN 40 MG PO TABS: 40.0000 mg | ORAL_TABLET | Freq: Every day | ORAL | 1 refills | Status: DC

## 2017-06-03 NOTE — Progress Notes (Signed)
MI / NSTEMI Discharge Checklist  Aspirin prescribed at discharge: yes  High Intensity Statin Prescribed?  Simvastatin 40 mg daily  Beta Blocker Prescribed: Toprol 25 twice daily  ADP Receptor Inhibitor Prescribed? (i.e. Plavix etc.- Includes Medically Managed Patients): Plavix and aspirin Was EF assessed during THIS hospitalization?  EF 50% on cardiac cath (YES = Measured in current episode of care or document plan to evaluate after discharge.)  Was Cardiac Rehab Phase II ordered prior to discharge? Yes  Patient ID: Donia GuilesKenneth Denslow, male   DOB: 12/01/1970, 47 y.o.   MRN: [id number]

## 2017-06-03 NOTE — Care Management (Signed)
Cardiac cath resulted in PCI. At present, it does not appear patient will no be discharged home on cost prohibitive antiplatelet medication

## 2017-06-03 NOTE — Progress Notes (Signed)
Inpatient Diabetes Program Recommendations  AACE/ADA: New Consensus Statement on Inpatient Glycemic Control (2015)  Target Ranges:  Prepandial:   less than 140 mg/dL      Peak postprandial:   less than 180 mg/dL (1-2 hours)      Critically ill patients:  140 - 180 mg/dL   Results for Peter Galloway, Peter Galloway (MRN [id number]) as of 06/03/2017 08:12  Ref. Range 06/02/2017 13:29 06/02/2017 14:57 06/02/2017 17:37 06/02/2017 20:52 06/03/2017 07:50  Glucose-Capillary Latest Ref Range: 65 - 99 mg/dL 409195 (H) 811161 (H) 914211 (H) 264 (H) 280 (H)    Admit NSTEMI.   History: DM2  Home DM Meds: Metformin 500 mg BID + Glipizide 10 mg daily  Current Orders: Novolog Moderate Correction Scale/ SSI (0-15 units) TID AC     Note patient had Cardiac Cath yesterday.  CBG elevated to 280 mg/dl this AM.    Home oral DM Medications on hold.     MD- Please consider the following in-hospital insulin adjustments:  1. If patient continues to have elevated fasting glucose levels, please consider starting Lantus 13 units daily (0.15 units/kg dosing)  2. Please check current Hemoglobin A1c level      --Will follow patient during hospitalization--  Ambrose FinlandJeannine Johnston Berlene Dixson RN, MSN, CDE Diabetes Coordinator Inpatient Glycemic Control Team Team Pager: [phone] (8a-5p)

## 2017-06-03 NOTE — Discharge Summary (Signed)
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 Diabetes mellitus without complication ([place])   . Hypertension   . Stroke [facility])     HOSPITAL COURSE:  Peter Galloway  is a 47 y.o. male with a known history of htn, diabetes type 2, hyperlipidemia came in because of left-sided chest pain.  Patient has been having left-sided chest pain for almost 1 week and he thought it was indigestion.,  Patient chest pain persisted  1 acute NST EMI - Troponin 1.4-- 5.33--2.7--2.48 -EKG changes concerning for acute coronary syndrome.  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 Successful PCI and stent to distal RCA lesion with a 3.0 x 18 mm [place] postdilated with a 3.25 x 12 mm [place] trek to 16 atm Lesion was reduced from 99% down to 0 TIMI-3 flow was maintained -Patient is on aspirin Plavix, statins, beta-blockers, losartan -Cardiac rehab -Okay to discharge from cardiology standpoint.  Patient is chest pain-free    2.Diabetes type 2:  -continue sliding scale insulin with coverage, hold metformin 48 hours due to contrast. -Resume glyburide  3.  Hyperlipidemia: Continue statins.  Overall  stable.  Discharge home with outpatient follow-up with cardiology  CONSULTS OBTAINED:  Treatment Team:  Yolonda Kida, MD  DRUG ALLERGIES:   Allergies  Allergen Reactions  . Lisinopril Cough    Other reaction(s): OTHER     DISCHARGE MEDICATIONS:   Allergies as of 06/03/2017      Reactions   Lisinopril Cough   Other reaction(s): OTHER      Medication List    STOP taking these medications   methocarbamol 750 MG tablet Commonly known as:  ROBAXIN-750     TAKE these medications   aspirin EC 81 MG tablet Take 81 mg by mouth daily.   clopidogrel 75 MG tablet Commonly known as:  PLAVIX Take 1 tablet (75 mg total) by mouth daily with breakfast. Start taking on:  06/04/2017   gabapentin 300 MG capsule Commonly known as:  NEURONTIN Take 300 mg by mouth 3 (three) times daily as needed.   glipiZIDE 10 MG tablet Commonly known as:  GLUCOTROL Take 10 mg by mouth daily before breakfast.   losartan 25 MG tablet Commonly known as:  COZAAR Take 25 mg by mouth daily.   metFORMIN 500 MG tablet Commonly known as:  GLUCOPHAGE Take 1 tablet (500 mg total) by mouth 2 (two) times daily with a meal. Start taking it from Jan 5th saturday What changed:  additional instructions   metoprolol tartrate 25 MG tablet Commonly known as:  LOPRESSOR Take 1 tablet (25 mg total) by mouth 2 (two) times daily.   simvastatin 40 MG tablet Commonly known as:  ZOCOR Take 1 tablet (  40 mg total) by mouth at bedtime. What changed:    medication strength  how much to take       If you experience worsening of your admission symptoms, develop shortness of breath, life threatening emergency, suicidal or homicidal thoughts you must seek medical attention immediately by calling 911 or calling your MD immediately  if symptoms less severe.  You Must read complete instructions/literature along with all the possible adverse reactions/side effects for all the Medicines you take and that have been  prescribed to you. Take any new Medicines after you have completely understood and accept all the possible adverse reactions/side effects.   Please note  You were cared for by a hospitalist during your hospital stay. If you have any questions about your discharge medications or the care you received while you were in the hospital after you are discharged, you can call the unit and asked to speak with the hospitalist on call if the hospitalist that took care of you is not available. Once you are discharged, your primary care physician will handle any further medical issues. Please note that NO REFILLS for any discharge medications will be authorized once you are discharged, as it is imperative that you return to your primary care physician (or establish a relationship with a primary care physician if you do not have one) for your aftercare needs so that they can reassess your need for medications and monitor your lab values. Today   SUBJECTIVE   No new complaint  VITAL SIGNS:  Blood pressure 122/71, pulse 77, temperature 97.8 F (36.6 C), temperature source Oral, resp. rate 18, height 5' 10"  (1.778 m), weight 85.3 kg (188 lb), SpO2 98 %.  I/O:    Intake/Output Summary (Last 24 hours) at 06/03/2017 0847 Last data filed at 06/03/2017 0109 Gross per 24 hour  Intake -  Output 0 ml  Net 0 ml    PHYSICAL EXAMINATION:  GENERAL:  47 y.o.-year-old patient lying in the bed with no acute distress.  EYES: Pupils equal, round, reactive to light and accommodation. No scleral icterus. Extraocular muscles intact.  HEENT: Head atraumatic, normocephalic. Oropharynx and nasopharynx clear.  NECK:  Supple, no jugular venous distention. No thyroid enlargement, no tenderness.  LUNGS: Normal breath sounds bilaterally, no wheezing, rales,rhonchi or crepitation. No use of accessory muscles of respiration.  CARDIOVASCULAR: S1, S2 normal. No murmurs, rubs, or gallops.  ABDOMEN: Soft, non-tender, non-distended. Bowel  sounds present. No organomegaly or mass.  EXTREMITIES: No pedal edema, cyanosis, or clubbing.  NEUROLOGIC: Cranial nerves II through XII are intact. Muscle strength 5/5 in all extremities. Sensation intact. Gait not checked.  PSYCHIATRIC: The patient is alert and oriented x 3.  SKIN: No obvious rash, lesion, or ulcer.   DATA REVIEW:   CBC  Recent Labs  Lab 06/03/17 0454  WBC 7.7  HGB 14.1  HCT 41.0  PLT 166    Chemistries  Recent Labs  Lab 06/03/17 0454  NA 136  K 4.3  CL 105  CO2 24  GLUCOSE 262*  BUN 15  CREATININE 0.71  CALCIUM 8.9    Microbiology Results   No results found for this or any previous visit (from the past 240 hour(s)).  RADIOLOGY:  Dg Chest 2 View  Result Date: 06/02/2017 CLINICAL DATA:  Chest pain EXAM: CHEST  2 VIEW COMPARISON:  12/24/2015 FINDINGS: The heart size and mediastinal contours are within normal limits. Both lungs are clear. The visualized skeletal structures are unremarkable. IMPRESSION: No active cardiopulmonary disease.  Electronically Signed   By: Kerby Moors M.D.   On: 06/02/2017 10:25     Management plans discussed with the patient, family and they are in agreement.  CODE STATUS:     Code Status Orders  (From admission, onward)        Start     Ordered   06/02/17 1235  Full code  Continuous     06/02/17 1236    Code Status History    Date Active Date Inactive Code Status Order ID Comments User Context   This patient has a current code status but no historical code status.      TOTAL TIME TAKING CARE OF THIS PATIENT: 40 minutes.    Fritzi Mandes M.D on 06/03/2017 at 8:47 AM  Between 7am to 6pm - Pager - [phone] After 6pm go to [email or web address] - password [email or web address] [place] [facility]  Office  [phone]  CC: Primary care physician; Kirk Ruths, MD

## 2017-06-03 NOTE — Progress Notes (Signed)
Iv and tele removed from patient. Discharge instructions given to patient. Verbalized understanding. No distress at this time. Family to transport patient home.

## 2017-06-03 NOTE — Progress Notes (Signed)
Patient referred to Cardiac Rehab with dx of NSTEMI / S/P PCI with DES to Distal RCA.  Patient has hx of DM, , HTN, HLD, previous CVA.  Patient former smoker.    Patient lying in bed watching TV.  Parents came in during this educational session.  Patient consented for this RN to continue talking in front of his parents.    "Heart Attack Bouncing Back" booklet given and reviewed with patient and daughter. Discussed the definition of CAD. Reviewed the location CAD and where stent was placed. Explained to patient about the stent card. Explained the purpose of the stent card. Instructed patient to keep stent card in her wallet.  ? Discussed modifiable risk factors including controlling blood pressure, cholesterol, and blood sugar; following heart healthy diet; maintaining healthy weight; exercise; and smoking cessation, if applicable. ?Note: Patient is a former smoker.  ? Discussed cardiac medications including rationale for taking, mechanisms of action, and side effects. Stressed the importance of taking medications as prescribed.  ? Discussed emergency plan for heart attack symptoms. Patient verbalized understanding of need to call 911 and not to drive herself to ER if having cardiac symptoms / chest pain.  ? Heart healthy diet of low sodium, low fat, low cholesterol heart healthy diet discussed.  Information on diet provided.  Smoking Cessation - Patient is a former smoker.    ? Exercise - Patient does not currently exercise. Patient informed this RN that he walks a lot on his job(s).  He works two jobs working from 9:30 a.m. to 11:00 p.m.  Benefits of exercise discussed.  Explained to patient he had been referred to the Cardiac Rehab program here at [facility].  Patient interested in participating but will need to check with his insurance to see what his co-pay would be, as he may not be able to afford this.  Cardiac Rehab brochure with department phone number provided to patient.  Explained to patient  that the [facility] Card[facility] administrative assistant will be calling to schedule an appointment for Cardiac Rehab.  In the meantime, walking was encouraged. Instructed patient to slowly increase and build up to 30 minutes of walking per day for a total of 150 minutes per week.  ? Patient and parents thanked me for the above information.    Army Meliaiane Arynn Armand, RN, BSN, [facility] Cardiovascular and Pulmonary Nurse Navigator   ?

## 2017-06-14 DIAGNOSIS — I25119 Atherosclerotic heart disease of native coronary artery with unspecified angina pectoris: Secondary | ICD-10-CM | POA: Insufficient documentation

## 2019-05-30 ENCOUNTER — Emergency Department: Payer: BLUE CROSS/BLUE SHIELD

## 2019-05-30 ENCOUNTER — Emergency Department
Admission: EM | Admit: 2019-05-30 | Discharge: 2019-05-30 | Disposition: A | Discharge status: Home / Self Care | Payer: BLUE CROSS/BLUE SHIELD | Attending: Emergency Medicine | Admitting: Emergency Medicine

## 2019-05-30 ENCOUNTER — Other Ambulatory Visit: Payer: Self-pay

## 2019-05-30 ENCOUNTER — Encounter: Payer: Self-pay | Admitting: Emergency Medicine

## 2019-05-30 DIAGNOSIS — Z79899 Other long term (current) drug therapy: Secondary | ICD-10-CM | POA: Diagnosis not present

## 2019-05-30 DIAGNOSIS — N200 Calculus of kidney: Secondary | ICD-10-CM | POA: Insufficient documentation

## 2019-05-30 DIAGNOSIS — Z87891 Personal history of nicotine dependence: Secondary | ICD-10-CM | POA: Insufficient documentation

## 2019-05-30 DIAGNOSIS — R109 Unspecified abdominal pain: Secondary | ICD-10-CM

## 2019-05-30 DIAGNOSIS — Z7982 Long term (current) use of aspirin: Secondary | ICD-10-CM | POA: Diagnosis not present

## 2019-05-30 DIAGNOSIS — E119 Type 2 diabetes mellitus without complications: Secondary | ICD-10-CM | POA: Diagnosis not present

## 2019-05-30 DIAGNOSIS — I10 Essential (primary) hypertension: Secondary | ICD-10-CM | POA: Diagnosis not present

## 2019-05-30 DIAGNOSIS — Z7984 Long term (current) use of oral hypoglycemic drugs: Secondary | ICD-10-CM | POA: Insufficient documentation

## 2019-05-30 LAB — BASIC METABOLIC PANEL
Anion gap: 8 (ref 5–15)
BUN: 19 mg/dL (ref 6–20)
CO2: 25 mmol/L (ref 22–32)
Calcium: 9.7 mg/dL (ref 8.9–10.3)
Chloride: 104 mmol/L (ref 98–111)
Creatinine, Ser: 0.8 mg/dL (ref 0.61–1.24)
GFR calc Af Amer: >60 mL/min (ref >60)
GFR calc non Af Amer: >60 mL/min (ref >60)
Glucose, Bld: 206 mg/dL — ABNORMAL HIGH (ref 70–99)
Potassium: 5.3 mmol/L — ABNORMAL HIGH (ref 3.5–5.1)
Sodium: 137 mmol/L (ref 135–145)

## 2019-05-30 LAB — CBC
HCT: 43.5 % (ref 39.0–52.0)
Hemoglobin: 14.8 g/dL (ref 13.0–17.0)
MCH: 32 pg (ref 26.0–34.0)
MCHC: 34 g/dL (ref 30.0–36.0)
MCV: 94 fL (ref 80.0–100.0)
Platelets: 189 10*3/uL (ref 150–400)
RBC: 4.63 MIL/uL (ref 4.22–5.81)
RDW: 13 % (ref 11.5–15.5)
WBC: 7.4 10*3/uL (ref 4.0–10.5)
nRBC: 0 % (ref 0.0–0.2)

## 2019-05-30 MED ORDER — MORPHINE SULFATE (PF) 4 MG/ML IV SOLN
4.0000 mg | Freq: Once | INTRAVENOUS | Status: DC
  Filled 2019-05-30: qty 1

## 2019-05-30 MED ORDER — HYDROCODONE-ACETAMINOPHEN 5-325 MG PO TABS: 1.0000 | ORAL_TABLET | Freq: Four times a day (QID) | ORAL | 0 refills | Status: AC | PRN

## 2019-05-30 MED ORDER — ONDANSETRON 4 MG PO TBDP: 4.0000 mg | ORAL_TABLET | Freq: Three times a day (TID) | ORAL | 0 refills | Status: DC | PRN

## 2019-05-30 MED ORDER — ONDANSETRON HCL 4 MG/2ML IJ SOLN
4.0000 mg | Freq: Once | INTRAMUSCULAR | Status: DC
  Filled 2019-05-30: qty 2

## 2019-05-30 MED ORDER — IBUPROFEN 600 MG PO TABS: 600.0000 mg | ORAL_TABLET | Freq: Three times a day (TID) | ORAL | 0 refills | Status: DC | PRN

## 2019-05-30 MED ORDER — SODIUM CHLORIDE 0.9 % IV BOLUS: 1000.0000 mL | Freq: Once | INTRAVENOUS | Status: DC

## 2019-05-30 MED ORDER — KETOROLAC TROMETHAMINE 60 MG/2ML IM SOLN
60.0000 mg | Freq: Once | INTRAMUSCULAR | Status: AC
  Administered 2019-05-30: 60 mg via INTRAMUSCULAR
  Filled 2019-05-30: qty 2

## 2019-05-30 MED ORDER — KETOROLAC TROMETHAMINE 30 MG/ML IJ SOLN
15.0000 mg | Freq: Once | INTRAMUSCULAR | Status: DC
  Filled 2019-05-30: qty 1

## 2019-05-30 NOTE — ED Triage Notes (Signed)
Pt presents to ED via wheelchair from [facility], per [facility] +hgb in urine. Pt c/o L sided flank pain x 4 days, denies urinary symptoms at this time. Pt states pain worse with some positions and some movements.

## 2019-05-30 NOTE — Discharge Instructions (Addendum)
Drink plenty of fluids  Follow-up with your doctor in 1 week. If symptoms do not improve, you should follow-up with a Urologist.

## 2019-05-30 NOTE — ED Provider Notes (Signed)
[facility] Emergency Department Provider Note  ____________________________________________   First MD Initiated Contact with Patient 05/30/19 1350     (approximate)  I have reviewed the triage vital signs and the nursing notes.   HISTORY  Chief Complaint Flank Pain    HPI Peter Galloway is a 48 y.o. male  With h/o HTN, stroke, DM here with L flank pain. Pt states that sx started around 4 days ago with mild, gradual onset of progressively worsening aching, gnawing L flank pain. Pain is down his entire L flank. It is constant but intermittently twinges with severe sharp pain, and occasionally will radiate toward the anterior lower left abdomen. No scrotal pain, swelling, or asymmetry. No fever, chills, or urinary symptoms. No nausea, vomiting, diarrhea. Pain worse w/ movement and palpation. No alleviating factors.        Past Medical History:  Diagnosis Date   Diabetes mellitus without complication ([place])    Hypertension    Stroke [facility])     Patient Active Problem List   Diagnosis Date Noted   Non-ST elevation MI (NSTEMI) ([place]) 06/02/2017    Past Surgical History:  Procedure Laterality Date   APPENDECTOMY     CORONARY STENT INTERVENTION N/A 06/02/2017   Procedure: CORONARY STENT INTERVENTION;  Surgeon: Yolonda Kida, MD;  Location: [place] CV LAB;  Service: Cardiovascular;  Laterality: N/A;   HERNIA REPAIR     LEFT HEART CATH AND CORONARY ANGIOGRAPHY N/A 06/02/2017   Procedure: LEFT HEART CATH AND CORONARY ANGIOGRAPHY;  Surgeon: Yolonda Kida, MD;  Location: [place] CV LAB;  Service: Cardiovascular;  Laterality: N/A;    Prior to Admission medications   Medication Sig Start Date End Date Taking? Authorizing Provider  aspirin EC 81 MG tablet Take 81 mg by mouth daily.    [provider]  clopidogrel (PLAVIX) 75 MG tablet Take 1 tablet (75 mg total) by mouth daily with breakfast. 06/04/17   Fritzi Mandes, MD    gabapentin (NEURONTIN) 300 MG capsule Take 300 mg by mouth 3 (three) times daily as needed. 03/18/17   [provider]  glipiZIDE (GLUCOTROL) 10 MG tablet Take 10 mg by mouth daily before breakfast.    [provider]  HYDROcodone-acetaminophen (NORCO/VICODIN) 5-325 MG tablet Take 1 tablet by mouth every 6 (six) hours as needed for moderate pain or severe pain. 05/30/19 05/29/20  Duffy Bruce, MD  ibuprofen (ADVIL) 600 MG tablet Take 1 tablet (600 mg total) by mouth every 8 (eight) hours as needed. 05/30/19   Duffy Bruce, MD  losartan (COZAAR) 25 MG tablet Take 25 mg by mouth daily.    [provider]  metFORMIN (GLUCOPHAGE) 500 MG tablet Take 1 tablet (500 mg total) by mouth 2 (two) times daily with a meal. Start taking it from Jan 5th saturday 06/03/17   Fritzi Mandes, MD  metoprolol tartrate (LOPRESSOR) 25 MG tablet Take 1 tablet (25 mg total) by mouth 2 (two) times daily. 06/03/17   Fritzi Mandes, MD  ondansetron (ZOFRAN ODT) 4 MG disintegrating tablet Take 1 tablet (4 mg total) by mouth every 8 (eight) hours as needed for nausea or vomiting. 05/30/19   Duffy Bruce, MD  simvastatin (ZOCOR) 40 MG tablet Take 1 tablet (40 mg total) by mouth at bedtime. 06/03/17   Fritzi Mandes, MD    Allergies Lisinopril  History reviewed. No pertinent family history.  Social History Social History   Tobacco Use   Smoking status: Former Smoker  Types: Cigarettes   Smokeless tobacco: Never Used  Substance Use Topics   Alcohol use: No   Drug use: No    Review of Systems  Review of Systems  Constitutional: Positive for fatigue. Negative for chills and fever.  HENT: Negative for sore throat.   Respiratory: Negative for shortness of breath.   Cardiovascular: Negative for chest pain.  Gastrointestinal: Negative for abdominal pain.  Genitourinary: Positive for flank pain and hematuria.  Musculoskeletal: Negative for neck pain.  Skin: Negative for rash and wound.   Allergic/Immunologic: Negative for immunocompromised state.  Neurological: Negative for weakness and numbness.  Hematological: Does not bruise/bleed easily.  All other systems reviewed and are negative.    ____________________________________________  PHYSICAL EXAM:      VITAL SIGNS: ED Triage Vitals  Enc Vitals Group     BP 05/30/19 1259 (!) 143/87     Pulse Rate 05/30/19 1259 83     Resp 05/30/19 1259 18     Temp 05/30/19 1259 98.5 F (36.9 C)     Temp Source 05/30/19 1259 Oral     SpO2 05/30/19 1259 98 %     Weight 05/30/19 1259 196 lb (88.9 kg)     Height 05/30/19 1259 5\' 9"  (1.753 m)     Head Circumference --      Peak Flow --      Pain Score 05/30/19 1258 5     Pain Loc --      Pain Edu? --      Excl. in GC? --      Physical Exam Vitals and nursing note reviewed.  Constitutional:      General: He is not in acute distress.    Appearance: He is well-developed.  HENT:     Head: Normocephalic and atraumatic.  Eyes:     Conjunctiva/sclera: Conjunctivae normal.  Cardiovascular:     Rate and Rhythm: Normal rate and regular rhythm.     Heart sounds: Normal heart sounds. No murmur. No friction rub.  Pulmonary:     Effort: Pulmonary effort is normal. No respiratory distress.     Breath sounds: Normal breath sounds. No wheezing or rales.  Abdominal:     General: Abdomen is flat. There is no distension.     Palpations: Abdomen is soft.     Tenderness: There is no abdominal tenderness.  Musculoskeletal:     Cervical back: Neck supple.     Comments: Moderate TTP to left flank. No overt CVAT. No rash or skin lesions.  Skin:    General: Skin is warm.     Capillary Refill: Capillary refill takes less than 2 seconds.  Neurological:     Mental Status: He is alert and oriented to person, place, and time.     Motor: No abnormal muscle tone.       ____________________________________________   LABS (all labs ordered are listed, but only abnormal results are  displayed)  Labs Reviewed  BASIC METABOLIC PANEL - Abnormal; Notable for the following components:      Result Value   Potassium 5.3 (*)    Glucose, Bld 206 (*)    All other components within normal limits  CBC    ____________________________________________  EKG: None ________________________________________  RADIOLOGY All imaging, including plain films, CT scans, and ultrasounds, independently reviewed by me, and interpretations confirmed via formal radiology reads.  ED MD interpretation:   CT Stone: 1 mm stone left kidney, no hydro, no bowel obstruction or abscess  Official radiology  report(s): CT Renal Stone Study  Result Date: 05/30/2019 CLINICAL DATA:  Left flank pain for 4 days EXAM: CT ABDOMEN AND PELVIS WITHOUT CONTRAST TECHNIQUE: Multidetector CT imaging of the abdomen and pelvis was performed following the standard protocol without oral or IV contrast. COMPARISON:  August 31, 2013 FINDINGS: Lower chest: Lung bases are clear. There are foci of coronary artery calcification. Hepatobiliary: No focal liver lesions are evident on this noncontrast enhanced study. Gallbladder wall is not appreciably thickened. There is no biliary duct dilatation. Pancreas: There is no pancreatic mass or inflammatory focus. Spleen: No splenic lesions are evident. Adrenals/Urinary Tract: Adrenals bilaterally appear unremarkable. There is no demonstrable renal mass or hydronephrosis on either side. There is a 1 mm calculus in the mid left kidney. There is no appreciable ureteral calculus on either side. Urinary bladder is midline with wall thickness within normal limits. Stomach/Bowel: There is no appreciable bowel wall or mesenteric thickening. There is no bowel obstruction. Terminal ileum appears normal. No evident free air or portal venous air. Vascular/Lymphatic: There is no abdominal aortic aneurysm. There is aortic and iliac artery atherosclerotic calcification. No adenopathy is evident in the abdomen  or pelvis. Reproductive: There are prostatic calculi. Prostate and seminal vesicles are normal in size and contour. There is no evident pelvic mass. Other: The appendix is absent. There is no periappendiceal region inflammation. There is no abscess or ascites in the abdomen or pelvis. Musculoskeletal: No blastic or lytic bone lesions are evident. There is no intramuscular or abdominal wall lesion. IMPRESSION: 1. 1 mm nonobstructing calculus in mid left kidney. No hydronephrosis or ureteral calculus on either side. Urinary bladder wall thickness is normal. Note that there are prostatic calculi. 2. No bowel obstruction. No abscess in the abdomen or pelvis. Appendix absent. No periappendiceal region inflammation. 3. Aortic Atherosclerosis (ICD10-I70.0). There also foci of iliac artery and coronary artery calcification. Electronically Signed   By: Bretta Bang III M.D.   On: 05/30/2019 15:01    ____________________________________________  PROCEDURES   Procedure(s) performed (including Critical Care):  Procedures  ____________________________________________  INITIAL IMPRESSION / MDM / ASSESSMENT AND PLAN / ED COURSE  As part of my medical decision making, I reviewed the following data within the electronic MEDICAL RECORD NUMBER Nursing notes reviewed and incorporated, Old chart reviewed, Notes from prior ED visits, and [place] Controlled Substance Database       *Peter Galloway was evaluated in Emergency Department on 05/30/2019 for the symptoms described in the history of present illness. He was evaluated in the context of the global COVID-19 pandemic, which necessitated consideration that the patient might be at risk for infection with the SARS-CoV-2 virus that causes COVID-19. Institutional protocols and algorithms that pertain to the evaluation of patients at risk for COVID-19 are in a state of rapid change based on information released by regulatory bodies including the CDC and federal and state  organizations. These policies and algorithms were followed during the patient's care in the ED.  Some ED evaluations and interventions may be delayed as a result of limited staffing during the pandemic.*     Medical Decision Making: Very pleasant 48 year old male here with left flank pain.  Pain is colicky in nature.  Exam as above.  Lab work shows mild hyperglycemia, normal renal function.  CBC within normal limits.  No fever, leukocytosis, or evidence of infection.  Clinically, I suspect possible recently passed stone versus musculoskeletal flank pain.  CT scan does show a 1 mm stone in the left  kidney, but no ongoing obstruction.  No other complications.  Abdomen is otherwise unremarkable.  Patient feels better with Toradol here.  Will give brief course of analgesic, encourage hydration, antiemetics, and outpatient follow-up.  ____________________________________________  FINAL CLINICAL IMPRESSION(S) / ED DIAGNOSES  Final diagnoses:  Left flank pain  Kidney stone     MEDICATIONS GIVEN DURING THIS VISIT:  Medications  ketorolac (TORADOL) injection 60 mg (60 mg Intramuscular Given 05/30/19 1504)     ED Discharge Orders         Ordered    ibuprofen (ADVIL) 600 MG tablet  Every 8 hours PRN     05/30/19 1537    HYDROcodone-acetaminophen (NORCO/VICODIN) 5-325 MG tablet  Every 6 hours PRN     05/30/19 1537    ondansetron (ZOFRAN ODT) 4 MG disintegrating tablet  Every 8 hours PRN     05/30/19 1537           Note:  This document was prepared using Dragon voice recognition software and may include unintentional dictation errors.   Shaune PollackIsaacs, Deatra Mcmahen, MD 05/30/19 1539

## 2019-05-30 NOTE — ED Notes (Signed)
Computer in room not working. Unable to obtain discharge signature. Patient verbalized understanding of discharge instructions and follow-up care.

## 2020-01-29 DIAGNOSIS — Z0000 Encounter for general adult medical examination without abnormal findings: Secondary | ICD-10-CM | POA: Insufficient documentation

## 2021-01-09 ENCOUNTER — Emergency Department
Admission: EM | Admit: 2021-01-09 | Discharge: 2021-01-09 | Disposition: A | Discharge status: Home / Self Care | Payer: BLUE CROSS/BLUE SHIELD | Attending: Emergency Medicine | Admitting: Emergency Medicine

## 2021-01-09 ENCOUNTER — Other Ambulatory Visit: Payer: Self-pay

## 2021-01-09 ENCOUNTER — Encounter: Payer: Self-pay | Admitting: Emergency Medicine

## 2021-01-09 ENCOUNTER — Emergency Department: Payer: BLUE CROSS/BLUE SHIELD

## 2021-01-09 DIAGNOSIS — N410 Acute prostatitis: Secondary | ICD-10-CM | POA: Diagnosis not present

## 2021-01-09 DIAGNOSIS — E1165 Type 2 diabetes mellitus with hyperglycemia: Secondary | ICD-10-CM | POA: Diagnosis not present

## 2021-01-09 DIAGNOSIS — Z79899 Other long term (current) drug therapy: Secondary | ICD-10-CM | POA: Diagnosis not present

## 2021-01-09 DIAGNOSIS — I10 Essential (primary) hypertension: Secondary | ICD-10-CM | POA: Insufficient documentation

## 2021-01-09 DIAGNOSIS — R300 Dysuria: Secondary | ICD-10-CM

## 2021-01-09 DIAGNOSIS — N419 Inflammatory disease of prostate, unspecified: Secondary | ICD-10-CM

## 2021-01-09 DIAGNOSIS — R1030 Lower abdominal pain, unspecified: Secondary | ICD-10-CM | POA: Insufficient documentation

## 2021-01-09 LAB — CBC WITH DIFFERENTIAL/PLATELET
Abs Immature Granulocytes: 0.03 10*3/uL (ref 0.00–0.07)
Basophils Absolute: 0 10*3/uL (ref 0.0–0.1)
Basophils Relative: 0 %
Eosinophils Absolute: 0.1 10*3/uL (ref 0.0–0.5)
Eosinophils Relative: 2 %
HCT: 41.9 % (ref 39.0–52.0)
Hemoglobin: 14.5 g/dL (ref 13.0–17.0)
Immature Granulocytes: 1 %
Lymphocytes Relative: 24 %
Lymphs Abs: 1.4 10*3/uL (ref 0.7–4.0)
MCH: 31.6 pg (ref 26.0–34.0)
MCHC: 34.6 g/dL (ref 30.0–36.0)
MCV: 91.3 fL (ref 80.0–100.0)
Monocytes Absolute: 0.4 10*3/uL (ref 0.1–1.0)
Monocytes Relative: 7 %
Neutro Abs: 3.9 10*3/uL (ref 1.7–7.7)
Neutrophils Relative %: 66 %
Platelets: 191 10*3/uL (ref 150–400)
RBC: 4.59 MIL/uL (ref 4.22–5.81)
RDW: 13.1 % (ref 11.5–15.5)
WBC: 5.9 10*3/uL (ref 4.0–10.5)
nRBC: 0 % (ref 0.0–0.2)

## 2021-01-09 LAB — URINALYSIS, COMPLETE (UACMP) WITH MICROSCOPIC
Bilirubin Urine: NEGATIVE
Glucose, UA: NEGATIVE mg/dL
Ketones, ur: NEGATIVE mg/dL
Leukocytes,Ua: NEGATIVE
Nitrite: NEGATIVE
Protein, ur: 30 mg/dL — AB
Specific Gravity, Urine: 1.015 (ref 1.005–1.030)
pH: 5 (ref 5.0–8.0)

## 2021-01-09 LAB — COMPREHENSIVE METABOLIC PANEL
ALT: 23 U/L (ref 0–44)
AST: 18 U/L (ref 15–41)
Albumin: 4.3 g/dL (ref 3.5–5.0)
Alkaline Phosphatase: 64 U/L (ref 38–126)
Anion gap: 6 (ref 5–15)
BUN: 25 mg/dL — ABNORMAL HIGH (ref 6–20)
CO2: 24 mmol/L (ref 22–32)
Calcium: 9.4 mg/dL (ref 8.9–10.3)
Chloride: 106 mmol/L (ref 98–111)
Creatinine, Ser: 1.05 mg/dL (ref 0.61–1.24)
GFR, Estimated: >60 mL/min (ref >60)
Glucose, Bld: 154 mg/dL — ABNORMAL HIGH (ref 70–99)
Potassium: 4.8 mmol/L (ref 3.5–5.1)
Sodium: 136 mmol/L (ref 135–145)
Total Bilirubin: 0.7 mg/dL (ref 0.3–1.2)
Total Protein: 7.3 g/dL (ref 6.5–8.1)

## 2021-01-09 MED ORDER — AMOXICILLIN-POT CLAVULANATE 875-125 MG PO TABS: 1.0000 | ORAL_TABLET | Freq: Two times a day (BID) | ORAL | 0 refills | Status: AC

## 2021-01-09 MED ORDER — SODIUM CHLORIDE 0.9 % IV BOLUS
1000.0000 mL | Freq: Once | INTRAVENOUS | Status: AC
Start: 2021-01-09 — End: 2021-01-09
  Administered 2021-01-09: 1000 mL via INTRAVENOUS

## 2021-01-09 NOTE — ED Provider Notes (Signed)
[facility] Emergency Department Provider Note  ____________________________________________   Event Date/Time   First MD Initiated Contact with Patient 01/09/21 1127     (approximate)  I have reviewed the triage vital signs and the nursing notes.   HISTORY  Chief Complaint Dysuria and Groin Pain    HPI Peter Galloway is a 50 y.o. male presents emergency department complaining of dysuria and pain that radiates into the scrotum.  Patient has history of kidney stones.  No concerns for STDs he states he has not had sex in over 5 years.  He denies any fever or chills.  No abdominal pain.  No vomiting or diarrhea  Past Medical History:  Diagnosis Date   Diabetes mellitus without complication ([facility])    Hypertension    Stroke [facility])     Patient Active Problem List   Diagnosis Date Noted   Non-ST elevation MI (NSTEMI) ([facility]) 06/02/2017    Past Surgical History:  Procedure Laterality Date   APPENDECTOMY     CORONARY STENT INTERVENTION N/A 06/02/2017   Procedure: CORONARY STENT INTERVENTION;  Surgeon: Alwyn Pea, MD;  Location: [facility] INVASIVE CV LAB;  Service: Cardiovascular;  Laterality: N/A;   HERNIA REPAIR     LEFT HEART CATH AND CORONARY ANGIOGRAPHY N/A 06/02/2017   Procedure: LEFT HEART CATH AND CORONARY ANGIOGRAPHY;  Surgeon: Alwyn Pea, MD;  Location: [facility] INVASIVE CV LAB;  Service: Cardiovascular;  Laterality: N/A;    Prior to Admission medications   Medication Sig Start Date End Date Taking? Authorizing Provider  amoxicillin-clavulanate (AUGMENTIN) 875-125 MG tablet Take 1 tablet by mouth 2 (two) times daily for 10 days. 01/09/21 01/19/21 Yes Romuald Mccaslin, Roselyn Bering, PA-C  aspirin EC 81 MG tablet Take 81 mg by mouth daily.    [provider]  clopidogrel (PLAVIX) 75 MG tablet Take 1 tablet (75 mg total) by mouth daily with breakfast. 06/04/17   Enedina Finner, MD  gabapentin (NEURONTIN) 300 MG capsule Take 300 mg by mouth 3 (three) times daily  as needed. 03/18/17   [provider]  glipiZIDE (GLUCOTROL) 10 MG tablet Take 10 mg by mouth daily before breakfast.    [provider]  ibuprofen (ADVIL) 600 MG tablet Take 1 tablet (600 mg total) by mouth every 8 (eight) hours as needed. 05/30/19   Shaune Pollack, MD  losartan (COZAAR) 25 MG tablet Take 25 mg by mouth daily.    [provider]  metFORMIN (GLUCOPHAGE) 500 MG tablet Take 1 tablet (500 mg total) by mouth 2 (two) times daily with a meal. Start taking it from Jan 5th saturday 06/03/17   Enedina Finner, MD  metoprolol tartrate (LOPRESSOR) 25 MG tablet Take 1 tablet (25 mg total) by mouth 2 (two) times daily. 06/03/17   Enedina Finner, MD  ondansetron (ZOFRAN ODT) 4 MG disintegrating tablet Take 1 tablet (4 mg total) by mouth every 8 (eight) hours as needed for nausea or vomiting. 05/30/19   Shaune Pollack, MD  simvastatin (ZOCOR) 40 MG tablet Take 1 tablet (40 mg total) by mouth at bedtime. 06/03/17   Enedina Finner, MD    Allergies Lisinopril  History reviewed. No pertinent family history.  Social History Social History   Tobacco Use   Smoking status: Former    Types: Cigarettes   Smokeless tobacco: Never  Substance Use Topics   Alcohol use: No   Drug use: No    Review of Systems  Constitutional: No fever/chills Eyes: No visual changes. ENT: No sore  throat. Respiratory: Denies cough Cardiovascular: Denies chest pain Gastrointestinal: Denies abdominal pain Genitourinary: Positive for dysuria. Musculoskeletal: Negative for back pain. Skin: Negative for rash. Psychiatric: no mood changes,     ____________________________________________   PHYSICAL EXAM:  VITAL SIGNS: ED Triage Vitals  Enc Vitals Group     BP 01/09/21 1111 (!) 159/103     Pulse Rate 01/09/21 1111 89     Resp 01/09/21 1111 16     Temp 01/09/21 1111 98.3 F (36.8 C)     Temp Source 01/09/21 1111 Oral     SpO2 01/09/21 1111 98 %     Weight 01/09/21 1112 195 lb 15.8 oz  (88.9 kg)     Height 01/09/21 1112 5\' 9"  (1.753 m)     Head Circumference --      Peak Flow --      Pain Score 01/09/21 1111 5     Pain Loc --      Pain Edu? --      Excl. in GC? --     Constitutional: Alert and oriented. Well appearing and in no acute distress. Eyes: Conjunctivae are normal.  Head: Atraumatic. Nose: No congestion/rhinnorhea. Mouth/Throat: Mucous membranes are moist.   Neck:  supple no lymphadenopathy noted Cardiovascular: Normal rate, regular rhythm. Heart sounds are normal Respiratory: Normal respiratory effort.  No retractions, lungs c t a  Abd: soft nontender bs normal all 4 quad GU: deferred Musculoskeletal: FROM all extremities, warm and well perfused Neurologic:  Normal speech and language.  Skin:  Skin is warm, dry and intact. No rash noted. Psychiatric: Mood and affect are normal. Speech and behavior are normal.  ____________________________________________   LABS (all labs ordered are listed, but only abnormal results are displayed)  Labs Reviewed  URINALYSIS, COMPLETE (UACMP) WITH MICROSCOPIC - Abnormal; Notable for the following components:      Result Value   Color, Urine YELLOW (*)    APPearance CLEAR (*)    Hgb urine dipstick MODERATE (*)    Protein, ur 30 (*)    Bacteria, UA RARE (*)    All other components within normal limits  COMPREHENSIVE METABOLIC PANEL - Abnormal; Notable for the following components:   Glucose, Bld 154 (*)    BUN 25 (*)    All other components within normal limits  CBC WITH DIFFERENTIAL/PLATELET   ____________________________________________   ____________________________________________  RADIOLOGY  CT renal stone  ____________________________________________   PROCEDURES  Procedure(s) performed: No  Procedures    ____________________________________________   INITIAL IMPRESSION / ASSESSMENT AND PLAN / ED COURSE  Pertinent labs & imaging results that were available during my care of the  patient were reviewed by me and considered in my medical decision making (see chart for details).   The patient is a 50 year old male presents emergency department with dysuria.  See HPI.  Physical exam shows patient appears stable  DDx: Kidney stone, UTI, bladder cancer, epididymitis  CBC is normal.  Urinalysis shows large amount of hemoglobin, protein and rare bacteria, metabolic panel is normal  CT renal stone study reviewed by me confirmed by radiology to be negative for kidney stone.  I did discuss findings with the patient.  Explained to him several things can cause dysuria.  We will treat with antibiotic for possible prostatitis versus epididymitis.  He agrees with treatment plan.  Is discharged stable condition.      Zamar Rudnick was evaluated in Emergency Department on 01/09/2021 for the symptoms described in the history of present illness.  He was evaluated in the context of the global COVID-19 pandemic, which necessitated consideration that the patient might be at risk for infection with the SARS-CoV-2 virus that causes COVID-19. Institutional protocols and algorithms that pertain to the evaluation of patients at risk for COVID-19 are in a state of rapid change based on information released by regulatory bodies including the CDC and federal and state organizations. These policies and algorithms were followed during the patient's care in the ED.    As part of my medical decision making, I reviewed the following data within the electronic MEDICAL RECORD NUMBER Nursing notes reviewed and incorporated, Labs reviewed , Old chart reviewed, Radiograph reviewed , Notes from prior ED visits, and [place] Controlled Substance Database  ____________________________________________   FINAL CLINICAL IMPRESSION(S) / ED DIAGNOSES  Final diagnoses:  Dysuria  Prostatitis, unspecified prostatitis type      NEW MEDICATIONS STARTED DURING THIS VISIT:  New Prescriptions   AMOXICILLIN-CLAVULANATE  (AUGMENTIN) 875-125 MG TABLET    Take 1 tablet by mouth 2 (two) times daily for 10 days.     Note:  This document was prepared using Dragon voice recognition software and may include unintentional dictation errors.    Faythe Ghee, PA-C 01/09/21 1309    Shaune Pollack, MD 01/14/21 [phone]

## 2021-01-09 NOTE — ED Triage Notes (Signed)
Peter Galloway comes into the ED via POV c/o dysuria and groin pain.  Peter Galloway states that it doesn burn with urination but there is sharp pain and pressure.  Peter Galloway ambulatory to triage at this time.  Peter Galloway explains the pain is in his penis, groin, and testicles.  Peter Galloway denies any discharge or sexual partners and concerns for STD's.  Peter Galloway denies any back pain as well, but symptoms have been ongoing 2-3 days.

## 2021-01-09 NOTE — Discharge Instructions (Addendum)
Follow-up with your regular doctor if not improving in 2 to 3 days.  Return emergency department worsening.  Take medication as prescribed Your CT today did not show a kidney stone

## 2021-08-13 IMAGING — CT CT RENAL STONE PROTOCOL
2 of 4 series · 15 of 46 positions shown, 17 images · non-contrast
Comparison: August 31, 2013

CLINICAL DATA: Left flank pain for 4 days

EXAM:
CT ABDOMEN AND PELVIS WITHOUT CONTRAST
TECHNIQUE: Multidetector CT imaging of the abdomen and pelvis was performed
following the standard protocol without oral or IV contrast.

[Series 4: coronal · coronal · 0.72mm/px · 3 of 147 slices shown]
[im 49/147  soft-tissue]
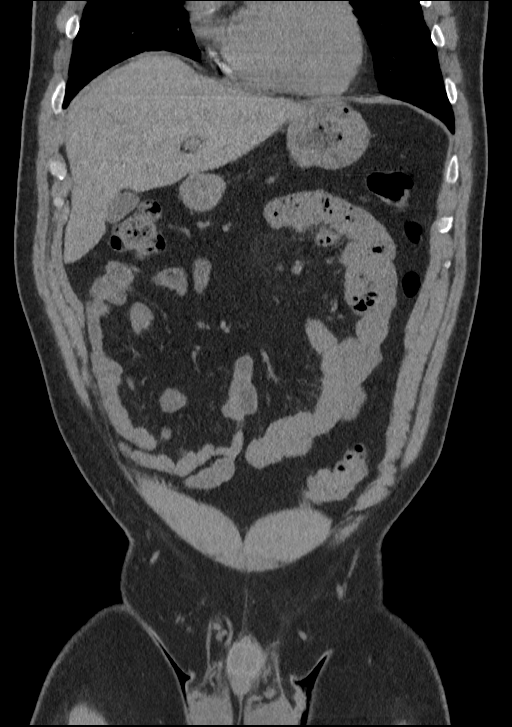
[im 65/147  soft-tissue]
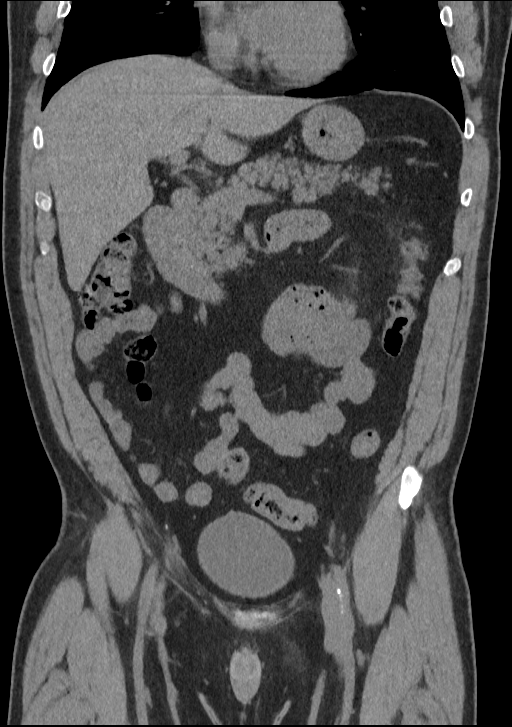
[im 82/147  soft-tissue]
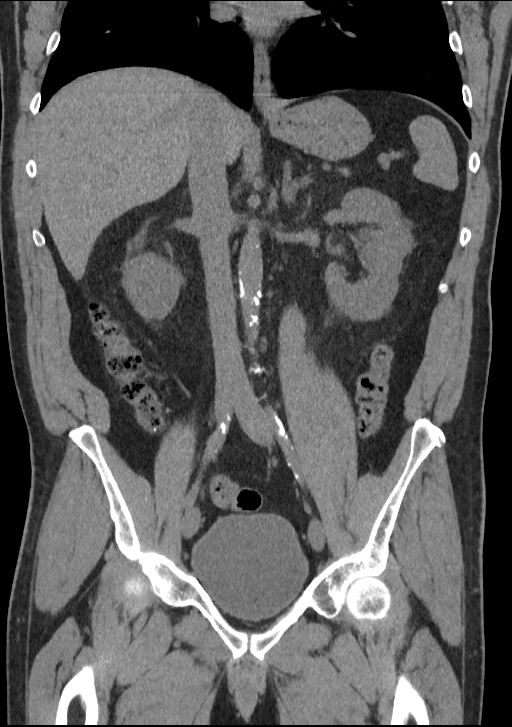

[Series 6: stone full standard · axial · 0.74mm/px · z∈[-531,-76]mm · 12 of 105 slices shown, 14 images]
[im 9/105  soft-tissue]
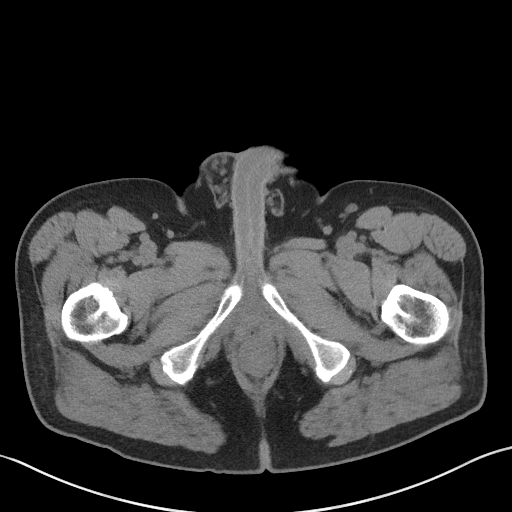
[im 9/105  bone]
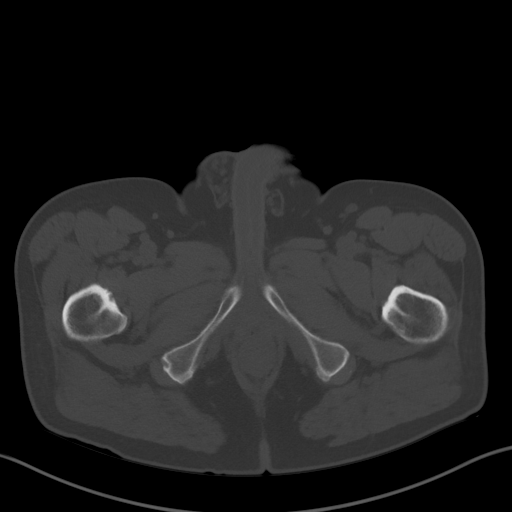
[im 17/105  soft-tissue]
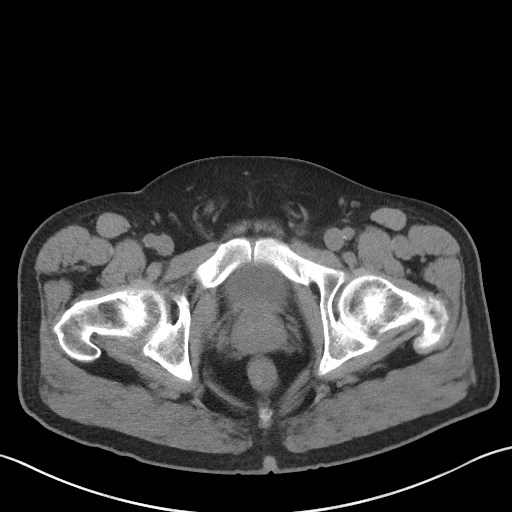
[im 25/105  soft-tissue]
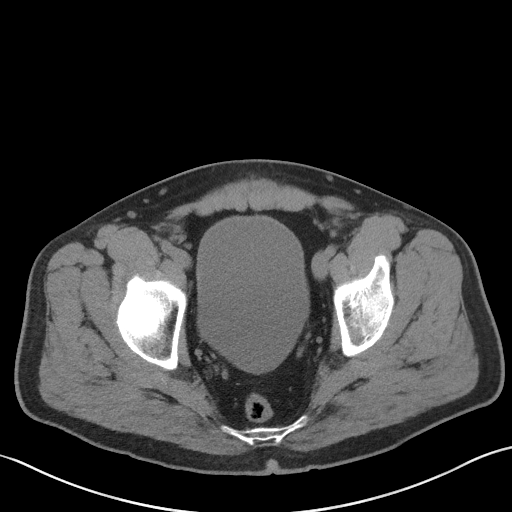
[im 34/105  soft-tissue]
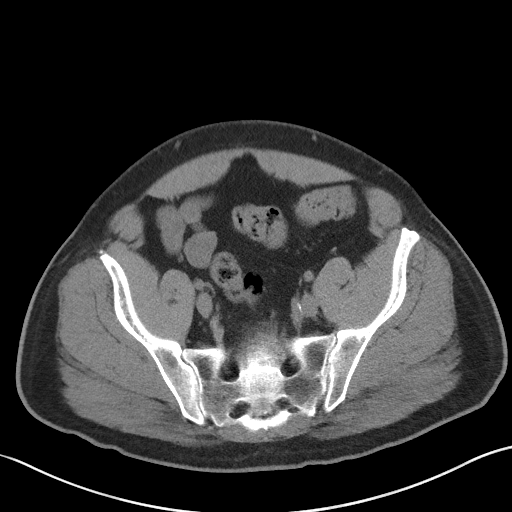
[im 42/105  soft-tissue]
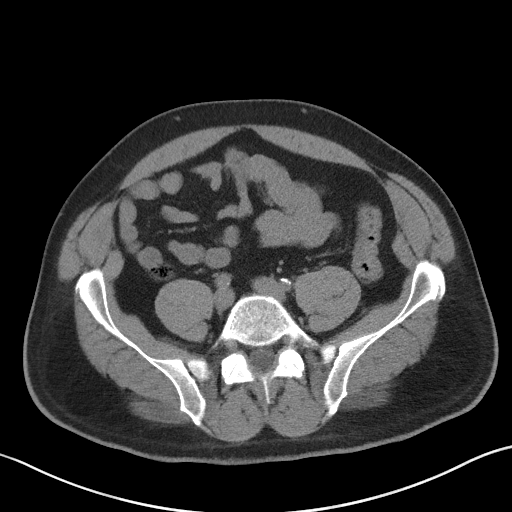
[im 50/105  soft-tissue]
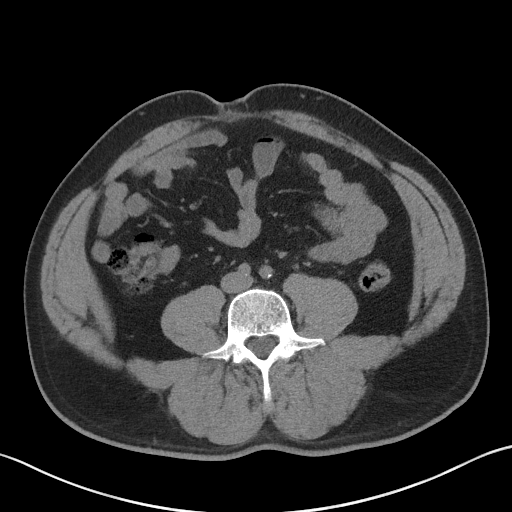
[im 59/105  soft-tissue]
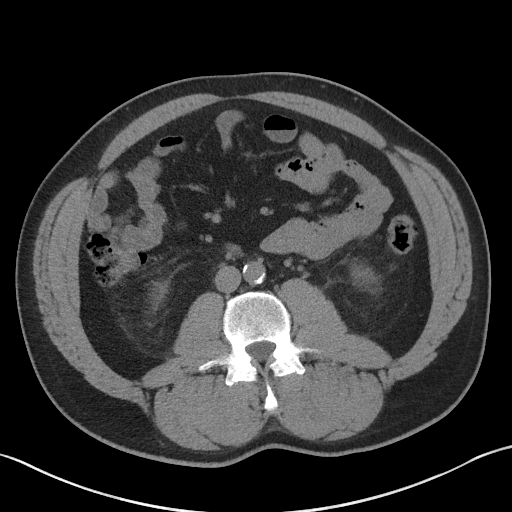
[im 67/105  soft-tissue]
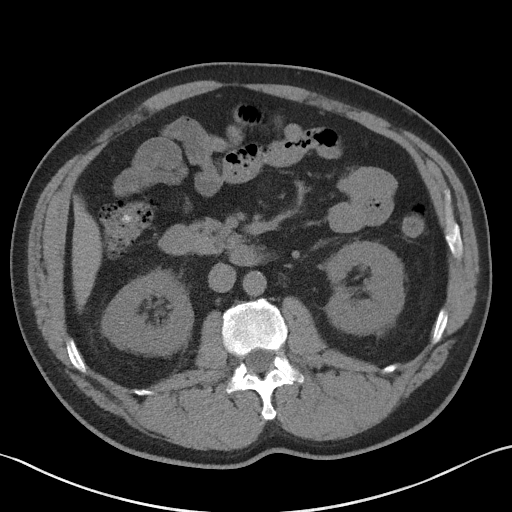
[im 75/105  soft-tissue]
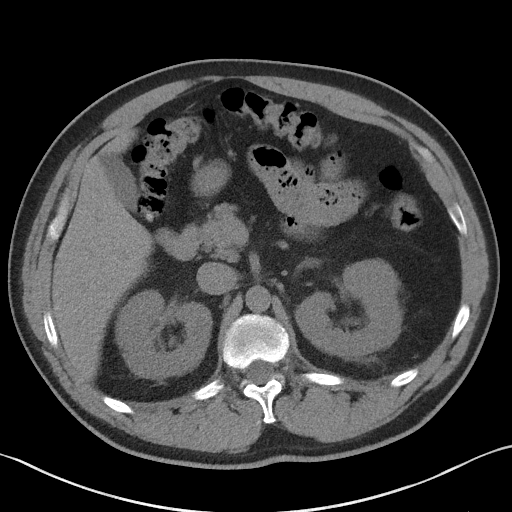
[im 75/105  bone]
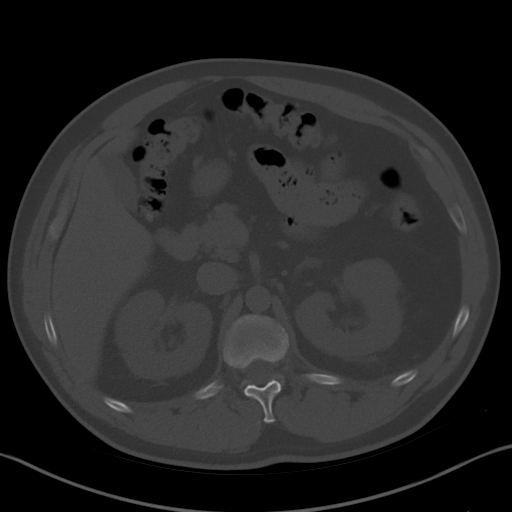
[im 84/105  soft-tissue]
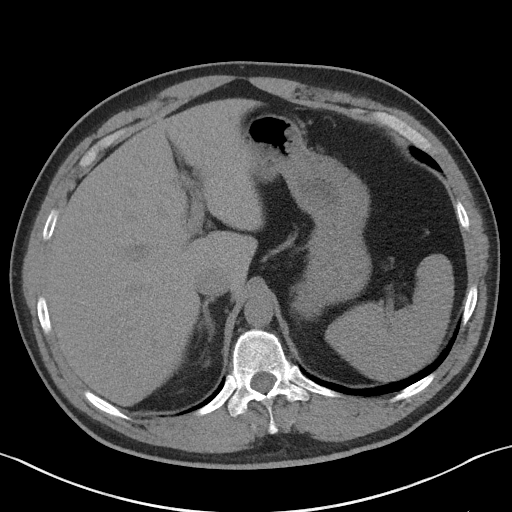
[im 92/105  soft-tissue]
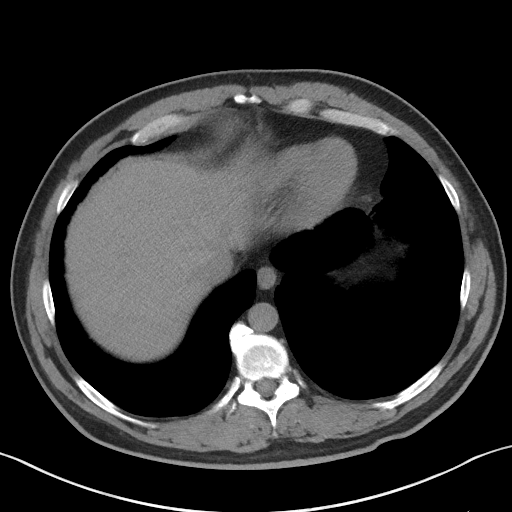
[im 100/105  soft-tissue]
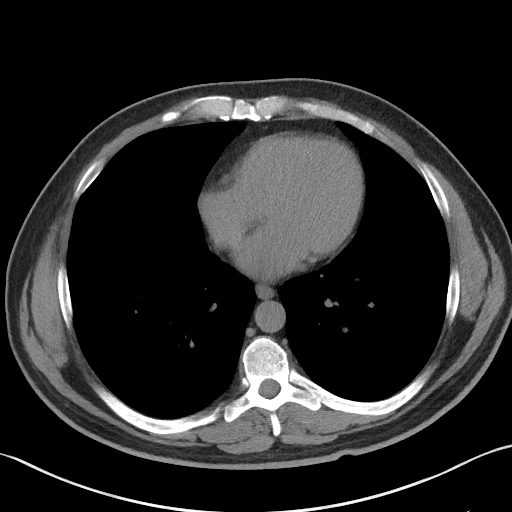

[15 of 46 positions shown; findings below may reference images not displayed]

FINDINGS: Lower chest: Lung bases are clear. There are foci of coronary artery
calcification.

Hepatobiliary: No focal liver lesions are evident on this
noncontrast enhanced study. Gallbladder wall is not appreciably
thickened. There is no biliary duct dilatation.

Pancreas: There is no pancreatic mass or inflammatory focus.

Spleen: No splenic lesions are evident.

Adrenals/Urinary Tract: Adrenals bilaterally appear unremarkable.
There is no demonstrable renal mass or hydronephrosis on either
side. There is a 1 mm calculus in the mid left kidney. There is no
appreciable ureteral calculus on either side. Urinary bladder is
midline with wall thickness within normal limits.

Stomach/Bowel: There is no appreciable bowel wall or mesenteric
thickening. There is no bowel obstruction. Terminal ileum appears
normal. No evident free air or portal venous air.

Vascular/Lymphatic: There is no abdominal aortic aneurysm. There is
aortic and iliac artery atherosclerotic calcification. No adenopathy
is evident in the abdomen or pelvis.

Reproductive: There are prostatic calculi. Prostate and seminal
vesicles are normal in size and contour. There is no evident pelvic
mass.

Other: The appendix is absent. There is no periappendiceal region
inflammation. There is no abscess or ascites in the abdomen or
pelvis.

Musculoskeletal: No blastic or lytic bone lesions are evident. There
is no intramuscular or abdominal wall lesion.
IMPRESSION: 1. 1 mm nonobstructing calculus in mid left kidney. No
hydronephrosis or ureteral calculus on either side. Urinary bladder
wall thickness is normal. Note that there are prostatic calculi.

2. No bowel obstruction. No abscess in the abdomen or pelvis.
Appendix absent. No periappendiceal region inflammation.

3. Aortic Atherosclerosis ([id number]). There also foci of iliac
artery and coronary artery calcification.

## 2021-09-15 DIAGNOSIS — I10 Essential (primary) hypertension: Secondary | ICD-10-CM | POA: Diagnosis not present

## 2021-09-15 DIAGNOSIS — Z955 Presence of coronary angioplasty implant and graft: Secondary | ICD-10-CM | POA: Diagnosis not present

## 2021-09-15 DIAGNOSIS — E7800 Pure hypercholesterolemia, unspecified: Secondary | ICD-10-CM | POA: Diagnosis not present

## 2021-09-15 DIAGNOSIS — I2510 Atherosclerotic heart disease of native coronary artery without angina pectoris: Secondary | ICD-10-CM | POA: Diagnosis not present

## 2021-10-13 DIAGNOSIS — R809 Proteinuria, unspecified: Secondary | ICD-10-CM | POA: Diagnosis not present

## 2021-10-13 DIAGNOSIS — E1129 Type 2 diabetes mellitus with other diabetic kidney complication: Secondary | ICD-10-CM | POA: Diagnosis not present

## 2021-10-13 DIAGNOSIS — Z794 Long term (current) use of insulin: Secondary | ICD-10-CM | POA: Diagnosis not present

## 2021-10-20 DIAGNOSIS — E1165 Type 2 diabetes mellitus with hyperglycemia: Secondary | ICD-10-CM | POA: Diagnosis not present

## 2021-10-20 DIAGNOSIS — E1129 Type 2 diabetes mellitus with other diabetic kidney complication: Secondary | ICD-10-CM | POA: Diagnosis not present

## 2021-10-20 DIAGNOSIS — E782 Mixed hyperlipidemia: Secondary | ICD-10-CM | POA: Diagnosis not present

## 2021-10-20 DIAGNOSIS — I10 Essential (primary) hypertension: Secondary | ICD-10-CM | POA: Diagnosis not present

## 2021-10-20 DIAGNOSIS — R809 Proteinuria, unspecified: Secondary | ICD-10-CM | POA: Diagnosis not present

## 2021-10-20 DIAGNOSIS — E1159 Type 2 diabetes mellitus with other circulatory complications: Secondary | ICD-10-CM | POA: Diagnosis not present

## 2021-10-20 DIAGNOSIS — E1142 Type 2 diabetes mellitus with diabetic polyneuropathy: Secondary | ICD-10-CM | POA: Diagnosis not present

## 2022-02-16 DIAGNOSIS — E1165 Type 2 diabetes mellitus with hyperglycemia: Secondary | ICD-10-CM | POA: Diagnosis not present

## 2022-02-23 DIAGNOSIS — E1129 Type 2 diabetes mellitus with other diabetic kidney complication: Secondary | ICD-10-CM | POA: Diagnosis not present

## 2022-02-23 DIAGNOSIS — R809 Proteinuria, unspecified: Secondary | ICD-10-CM | POA: Diagnosis not present

## 2022-02-23 DIAGNOSIS — E782 Mixed hyperlipidemia: Secondary | ICD-10-CM | POA: Diagnosis not present

## 2022-02-23 DIAGNOSIS — E1142 Type 2 diabetes mellitus with diabetic polyneuropathy: Secondary | ICD-10-CM | POA: Diagnosis not present

## 2022-02-23 DIAGNOSIS — I10 Essential (primary) hypertension: Secondary | ICD-10-CM | POA: Diagnosis not present

## 2022-02-23 DIAGNOSIS — E1159 Type 2 diabetes mellitus with other circulatory complications: Secondary | ICD-10-CM | POA: Diagnosis not present

## 2022-02-24 DIAGNOSIS — Z03818 Encounter for observation for suspected exposure to other biological agents ruled out: Secondary | ICD-10-CM | POA: Diagnosis not present

## 2022-02-24 DIAGNOSIS — U071 COVID-19: Secondary | ICD-10-CM | POA: Diagnosis not present

## 2022-03-09 DIAGNOSIS — R809 Proteinuria, unspecified: Secondary | ICD-10-CM | POA: Diagnosis not present

## 2022-03-09 DIAGNOSIS — E1142 Type 2 diabetes mellitus with diabetic polyneuropathy: Secondary | ICD-10-CM | POA: Diagnosis not present

## 2022-03-09 DIAGNOSIS — E1129 Type 2 diabetes mellitus with other diabetic kidney complication: Secondary | ICD-10-CM | POA: Diagnosis not present

## 2022-05-13 DIAGNOSIS — R809 Proteinuria, unspecified: Secondary | ICD-10-CM | POA: Diagnosis not present

## 2022-05-13 DIAGNOSIS — R8290 Unspecified abnormal findings in urine: Secondary | ICD-10-CM | POA: Diagnosis not present

## 2022-05-13 DIAGNOSIS — E1122 Type 2 diabetes mellitus with diabetic chronic kidney disease: Secondary | ICD-10-CM | POA: Diagnosis not present

## 2022-05-13 DIAGNOSIS — N182 Chronic kidney disease, stage 2 (mild): Secondary | ICD-10-CM | POA: Diagnosis not present

## 2022-05-26 DIAGNOSIS — E1140 Type 2 diabetes mellitus with diabetic neuropathy, unspecified: Secondary | ICD-10-CM | POA: Diagnosis not present

## 2022-05-26 DIAGNOSIS — Z7984 Long term (current) use of oral hypoglycemic drugs: Secondary | ICD-10-CM | POA: Diagnosis not present

## 2022-05-26 DIAGNOSIS — I2510 Atherosclerotic heart disease of native coronary artery without angina pectoris: Secondary | ICD-10-CM | POA: Diagnosis not present

## 2022-05-26 DIAGNOSIS — Z833 Family history of diabetes mellitus: Secondary | ICD-10-CM | POA: Diagnosis not present

## 2022-05-26 DIAGNOSIS — I10 Essential (primary) hypertension: Secondary | ICD-10-CM | POA: Diagnosis not present

## 2022-05-26 DIAGNOSIS — Z8673 Personal history of transient ischemic attack (TIA), and cerebral infarction without residual deficits: Secondary | ICD-10-CM | POA: Diagnosis not present

## 2022-05-26 DIAGNOSIS — I252 Old myocardial infarction: Secondary | ICD-10-CM | POA: Diagnosis not present

## 2022-05-26 DIAGNOSIS — Z85828 Personal history of other malignant neoplasm of skin: Secondary | ICD-10-CM | POA: Diagnosis not present

## 2022-05-26 DIAGNOSIS — Z8249 Family history of ischemic heart disease and other diseases of the circulatory system: Secondary | ICD-10-CM | POA: Diagnosis not present

## 2022-05-26 DIAGNOSIS — Z794 Long term (current) use of insulin: Secondary | ICD-10-CM | POA: Diagnosis not present

## 2022-05-26 DIAGNOSIS — Z7902 Long term (current) use of antithrombotics/antiplatelets: Secondary | ICD-10-CM | POA: Diagnosis not present

## 2022-05-26 DIAGNOSIS — E785 Hyperlipidemia, unspecified: Secondary | ICD-10-CM | POA: Diagnosis not present

## 2022-06-03 DIAGNOSIS — E1159 Type 2 diabetes mellitus with other circulatory complications: Secondary | ICD-10-CM | POA: Diagnosis not present

## 2022-06-03 DIAGNOSIS — R809 Proteinuria, unspecified: Secondary | ICD-10-CM | POA: Diagnosis not present

## 2022-06-03 DIAGNOSIS — E1129 Type 2 diabetes mellitus with other diabetic kidney complication: Secondary | ICD-10-CM | POA: Diagnosis not present

## 2022-06-08 DIAGNOSIS — Z794 Long term (current) use of insulin: Secondary | ICD-10-CM | POA: Diagnosis not present

## 2022-06-08 DIAGNOSIS — E1159 Type 2 diabetes mellitus with other circulatory complications: Secondary | ICD-10-CM | POA: Diagnosis not present

## 2022-06-08 DIAGNOSIS — E1129 Type 2 diabetes mellitus with other diabetic kidney complication: Secondary | ICD-10-CM | POA: Diagnosis not present

## 2022-06-08 DIAGNOSIS — E1142 Type 2 diabetes mellitus with diabetic polyneuropathy: Secondary | ICD-10-CM | POA: Diagnosis not present

## 2022-06-08 DIAGNOSIS — E782 Mixed hyperlipidemia: Secondary | ICD-10-CM | POA: Diagnosis not present

## 2022-06-08 DIAGNOSIS — I10 Essential (primary) hypertension: Secondary | ICD-10-CM | POA: Diagnosis not present

## 2022-06-08 DIAGNOSIS — R809 Proteinuria, unspecified: Secondary | ICD-10-CM | POA: Diagnosis not present

## 2022-09-07 DIAGNOSIS — Z955 Presence of coronary angioplasty implant and graft: Secondary | ICD-10-CM | POA: Diagnosis not present

## 2022-09-07 DIAGNOSIS — I2510 Atherosclerotic heart disease of native coronary artery without angina pectoris: Secondary | ICD-10-CM | POA: Diagnosis not present

## 2022-09-07 DIAGNOSIS — R809 Proteinuria, unspecified: Secondary | ICD-10-CM | POA: Diagnosis not present

## 2022-09-07 DIAGNOSIS — I10 Essential (primary) hypertension: Secondary | ICD-10-CM | POA: Diagnosis not present

## 2022-09-07 DIAGNOSIS — E7800 Pure hypercholesterolemia, unspecified: Secondary | ICD-10-CM | POA: Diagnosis not present

## 2022-09-07 DIAGNOSIS — E1129 Type 2 diabetes mellitus with other diabetic kidney complication: Secondary | ICD-10-CM | POA: Diagnosis not present

## 2022-09-07 DIAGNOSIS — E1122 Type 2 diabetes mellitus with diabetic chronic kidney disease: Secondary | ICD-10-CM | POA: Diagnosis not present

## 2022-09-07 DIAGNOSIS — Z8673 Personal history of transient ischemic attack (TIA), and cerebral infarction without residual deficits: Secondary | ICD-10-CM | POA: Diagnosis not present

## 2022-09-14 DIAGNOSIS — I10 Essential (primary) hypertension: Secondary | ICD-10-CM | POA: Diagnosis not present

## 2022-09-14 DIAGNOSIS — E782 Mixed hyperlipidemia: Secondary | ICD-10-CM | POA: Diagnosis not present

## 2022-09-14 DIAGNOSIS — E1129 Type 2 diabetes mellitus with other diabetic kidney complication: Secondary | ICD-10-CM | POA: Diagnosis not present

## 2022-09-14 DIAGNOSIS — E1142 Type 2 diabetes mellitus with diabetic polyneuropathy: Secondary | ICD-10-CM | POA: Diagnosis not present

## 2022-09-14 DIAGNOSIS — E1159 Type 2 diabetes mellitus with other circulatory complications: Secondary | ICD-10-CM | POA: Diagnosis not present

## 2022-09-14 DIAGNOSIS — R809 Proteinuria, unspecified: Secondary | ICD-10-CM | POA: Diagnosis not present

## 2022-09-29 DIAGNOSIS — J0100 Acute maxillary sinusitis, unspecified: Secondary | ICD-10-CM | POA: Diagnosis not present

## 2022-11-16 DIAGNOSIS — E1122 Type 2 diabetes mellitus with diabetic chronic kidney disease: Secondary | ICD-10-CM | POA: Diagnosis not present

## 2022-11-16 DIAGNOSIS — N189 Chronic kidney disease, unspecified: Secondary | ICD-10-CM | POA: Diagnosis not present

## 2022-11-16 DIAGNOSIS — R808 Other proteinuria: Secondary | ICD-10-CM | POA: Diagnosis not present

## 2022-11-16 DIAGNOSIS — I10 Essential (primary) hypertension: Secondary | ICD-10-CM | POA: Diagnosis not present

## 2022-11-16 DIAGNOSIS — N182 Chronic kidney disease, stage 2 (mild): Secondary | ICD-10-CM | POA: Diagnosis not present

## 2022-11-16 DIAGNOSIS — R600 Localized edema: Secondary | ICD-10-CM | POA: Diagnosis not present

## 2023-01-11 DIAGNOSIS — E1159 Type 2 diabetes mellitus with other circulatory complications: Secondary | ICD-10-CM | POA: Diagnosis not present

## 2023-01-18 DIAGNOSIS — R809 Proteinuria, unspecified: Secondary | ICD-10-CM | POA: Diagnosis not present

## 2023-01-18 DIAGNOSIS — I10 Essential (primary) hypertension: Secondary | ICD-10-CM | POA: Diagnosis not present

## 2023-01-18 DIAGNOSIS — E1159 Type 2 diabetes mellitus with other circulatory complications: Secondary | ICD-10-CM | POA: Diagnosis not present

## 2023-01-18 DIAGNOSIS — E1142 Type 2 diabetes mellitus with diabetic polyneuropathy: Secondary | ICD-10-CM | POA: Diagnosis not present

## 2023-01-18 DIAGNOSIS — E1129 Type 2 diabetes mellitus with other diabetic kidney complication: Secondary | ICD-10-CM | POA: Diagnosis not present

## 2023-01-18 DIAGNOSIS — E782 Mixed hyperlipidemia: Secondary | ICD-10-CM | POA: Diagnosis not present

## 2023-02-10 ENCOUNTER — Observation Stay: Payer: 59

## 2023-02-10 ENCOUNTER — Other Ambulatory Visit: Payer: Self-pay

## 2023-02-10 ENCOUNTER — Observation Stay
Admission: EM | Admit: 2023-02-10 | Discharge: 2023-02-11 | Disposition: A | Discharge status: Home / Self Care | Payer: 59 | Attending: Internal Medicine | Admitting: Internal Medicine

## 2023-02-10 ENCOUNTER — Encounter: Payer: Self-pay | Admitting: Medical Oncology

## 2023-02-10 ENCOUNTER — Emergency Department: Payer: 59

## 2023-02-10 DIAGNOSIS — Z7902 Long term (current) use of antithrombotics/antiplatelets: Secondary | ICD-10-CM | POA: Insufficient documentation

## 2023-02-10 DIAGNOSIS — R29810 Facial weakness: Secondary | ICD-10-CM | POA: Diagnosis not present

## 2023-02-10 DIAGNOSIS — I6522 Occlusion and stenosis of left carotid artery: Secondary | ICD-10-CM | POA: Diagnosis not present

## 2023-02-10 DIAGNOSIS — R29818 Other symptoms and signs involving the nervous system: Secondary | ICD-10-CM | POA: Diagnosis not present

## 2023-02-10 DIAGNOSIS — Z955 Presence of coronary angioplasty implant and graft: Secondary | ICD-10-CM | POA: Insufficient documentation

## 2023-02-10 DIAGNOSIS — I6389 Other cerebral infarction: Secondary | ICD-10-CM

## 2023-02-10 DIAGNOSIS — I129 Hypertensive chronic kidney disease with stage 1 through stage 4 chronic kidney disease, or unspecified chronic kidney disease: Secondary | ICD-10-CM | POA: Insufficient documentation

## 2023-02-10 DIAGNOSIS — R531 Weakness: Secondary | ICD-10-CM

## 2023-02-10 DIAGNOSIS — Z79899 Other long term (current) drug therapy: Secondary | ICD-10-CM | POA: Insufficient documentation

## 2023-02-10 DIAGNOSIS — E1122 Type 2 diabetes mellitus with diabetic chronic kidney disease: Secondary | ICD-10-CM | POA: Diagnosis not present

## 2023-02-10 DIAGNOSIS — Z87891 Personal history of nicotine dependence: Secondary | ICD-10-CM | POA: Diagnosis not present

## 2023-02-10 DIAGNOSIS — R4781 Slurred speech: Secondary | ICD-10-CM | POA: Diagnosis not present

## 2023-02-10 DIAGNOSIS — Z7982 Long term (current) use of aspirin: Secondary | ICD-10-CM | POA: Diagnosis not present

## 2023-02-10 DIAGNOSIS — N182 Chronic kidney disease, stage 2 (mild): Secondary | ICD-10-CM | POA: Diagnosis not present

## 2023-02-10 DIAGNOSIS — Z8673 Personal history of transient ischemic attack (TIA), and cerebral infarction without residual deficits: Secondary | ICD-10-CM | POA: Diagnosis not present

## 2023-02-10 DIAGNOSIS — I2510 Atherosclerotic heart disease of native coronary artery without angina pectoris: Secondary | ICD-10-CM | POA: Insufficient documentation

## 2023-02-10 DIAGNOSIS — E119 Type 2 diabetes mellitus without complications: Secondary | ICD-10-CM

## 2023-02-10 DIAGNOSIS — R471 Dysarthria and anarthria: Secondary | ICD-10-CM | POA: Diagnosis not present

## 2023-02-10 DIAGNOSIS — I6782 Cerebral ischemia: Secondary | ICD-10-CM | POA: Diagnosis not present

## 2023-02-10 DIAGNOSIS — I639 Cerebral infarction, unspecified: Secondary | ICD-10-CM | POA: Diagnosis not present

## 2023-02-10 DIAGNOSIS — Z794 Long term (current) use of insulin: Secondary | ICD-10-CM

## 2023-02-10 LAB — CBC
HCT: 43.8 % (ref 39.0–52.0)
Hemoglobin: 14.8 g/dL (ref 13.0–17.0)
MCH: 32.2 pg (ref 26.0–34.0)
MCHC: 33.8 g/dL (ref 30.0–36.0)
MCV: 95.4 fL (ref 80.0–100.0)
Platelets: 144 10*3/uL — ABNORMAL LOW (ref 150–400)
RBC: 4.59 MIL/uL (ref 4.22–5.81)
RDW: 13.5 % (ref 11.5–15.5)
WBC: 7.2 10*3/uL (ref 4.0–10.5)
nRBC: 0 % (ref 0.0–0.2)

## 2023-02-10 LAB — COMPREHENSIVE METABOLIC PANEL
ALT: 29 U/L (ref 0–44)
AST: 26 U/L (ref 15–41)
Albumin: 3.6 g/dL (ref 3.5–5.0)
Alkaline Phosphatase: 56 U/L (ref 38–126)
Anion gap: 9 (ref 5–15)
BUN: 26 mg/dL — ABNORMAL HIGH (ref 6–20)
CO2: 22 mmol/L (ref 22–32)
Calcium: 8.8 mg/dL — ABNORMAL LOW (ref 8.9–10.3)
Chloride: 105 mmol/L (ref 98–111)
Creatinine, Ser: 1.12 mg/dL (ref 0.61–1.24)
GFR, Estimated: >60 mL/min (ref >60)
Glucose, Bld: 116 mg/dL — ABNORMAL HIGH (ref 70–99)
Potassium: 4.6 mmol/L (ref 3.5–5.1)
Sodium: 136 mmol/L (ref 135–145)
Total Bilirubin: 0.6 mg/dL (ref 0.3–1.2)
Total Protein: 6.4 g/dL — ABNORMAL LOW (ref 6.5–8.1)

## 2023-02-10 LAB — CBG MONITORING, ED: Glucose-Capillary: 132 mg/dL — ABNORMAL HIGH (ref 70–99)

## 2023-02-10 LAB — DIFFERENTIAL
Abs Immature Granulocytes: 0.02 10*3/uL (ref 0.00–0.07)
Basophils Absolute: 0 10*3/uL (ref 0.0–0.1)
Basophils Relative: 0 %
Eosinophils Absolute: 0.2 10*3/uL (ref 0.0–0.5)
Eosinophils Relative: 2 %
Immature Granulocytes: 0 %
Lymphocytes Relative: 19 %
Lymphs Abs: 1.4 10*3/uL (ref 0.7–4.0)
Monocytes Absolute: 0.5 10*3/uL (ref 0.1–1.0)
Monocytes Relative: 8 %
Neutro Abs: 5.1 10*3/uL (ref 1.7–7.7)
Neutrophils Relative %: 71 %

## 2023-02-10 LAB — PROTIME-INR
INR: 1.1 (ref 0.8–1.2)
Prothrombin Time: 14.2 s (ref 11.4–15.2)

## 2023-02-10 LAB — LIPID PANEL
Cholesterol: 121 mg/dL (ref 0–200)
HDL: 36 mg/dL — ABNORMAL LOW (ref >40)
LDL Cholesterol: 36 mg/dL (ref 0–99)
Total CHOL/HDL Ratio: 3.4 ratio
Triglycerides: 243 mg/dL — ABNORMAL HIGH (ref <150)
VLDL: 49 mg/dL — ABNORMAL HIGH (ref 0–40)

## 2023-02-10 LAB — APTT: aPTT: 25 s (ref 24–36)

## 2023-02-10 LAB — HIV ANTIBODY (ROUTINE TESTING W REFLEX): HIV Screen 4th Generation wRfx: NONREACTIVE

## 2023-02-10 LAB — GLUCOSE, CAPILLARY
Glucose-Capillary: 258 mg/dL — ABNORMAL HIGH (ref 70–99)
Glucose-Capillary: 91 mg/dL (ref 70–99)

## 2023-02-10 LAB — ETHANOL: Alcohol, Ethyl (B): <10 mg/dL (ref <10)

## 2023-02-10 MED ORDER — ACETAMINOPHEN 325 MG PO TABS: 650.0000 mg | ORAL_TABLET | ORAL | Status: DC | PRN

## 2023-02-10 MED ORDER — INSULIN ASPART 100 UNIT/ML IJ SOLN: 0.0000 [IU] | Freq: Every day | INTRAMUSCULAR | Status: DC

## 2023-02-10 MED ORDER — CLOPIDOGREL BISULFATE 75 MG PO TABS
75.0000 mg | ORAL_TABLET | Freq: Every day | ORAL | Status: DC
  Administered 2023-02-10 – 2023-02-11 (×2): 75 mg via ORAL
  Filled 2023-02-10 (×2): qty 1

## 2023-02-10 MED ORDER — ASPIRIN 81 MG PO TBEC: 81.0000 mg | DELAYED_RELEASE_TABLET | Freq: Every day | ORAL | Status: DC

## 2023-02-10 MED ORDER — PNEUMOCOCCAL 20-VAL CONJ VACC 0.5 ML IM SUSY
0.5000 mL | PREFILLED_SYRINGE | INTRAMUSCULAR | Status: DC
  Filled 2023-02-10: qty 0.5

## 2023-02-10 MED ORDER — ENOXAPARIN SODIUM 40 MG/0.4ML IJ SOSY
40.0000 mg | PREFILLED_SYRINGE | INTRAMUSCULAR | Status: DC
  Administered 2023-02-10: 40 mg via SUBCUTANEOUS
  Filled 2023-02-10: qty 0.4

## 2023-02-10 MED ORDER — INFLUENZA VIRUS VACC SPLIT PF (FLUZONE) 0.5 ML IM SUSY: 0.5000 mL | PREFILLED_SYRINGE | INTRAMUSCULAR | Status: DC

## 2023-02-10 MED ORDER — EMPAGLIFLOZIN 25 MG PO TABS
25.0000 mg | ORAL_TABLET | Freq: Every day | ORAL | Status: DC
  Administered 2023-02-11: 25 mg via ORAL
  Filled 2023-02-10: qty 1

## 2023-02-10 MED ORDER — ASPIRIN 81 MG PO CHEW
324.0000 mg | CHEWABLE_TABLET | Freq: Once | ORAL | Status: DC
  Filled 2023-02-10: qty 4

## 2023-02-10 MED ORDER — ROSUVASTATIN CALCIUM 20 MG PO TABS
20.0000 mg | ORAL_TABLET | Freq: Every day | ORAL | Status: DC
  Administered 2023-02-10: 20 mg via ORAL
  Filled 2023-02-10: qty 1

## 2023-02-10 MED ORDER — IOHEXOL 350 MG/ML SOLN
100.0000 mL | Freq: Once | INTRAVENOUS | Status: AC | PRN
  Administered 2023-02-10: 100 mL via INTRAVENOUS

## 2023-02-10 MED ORDER — INSULIN ASPART 100 UNIT/ML IJ SOLN
0.0000 [IU] | Freq: Three times a day (TID) | INTRAMUSCULAR | Status: DC
  Administered 2023-02-11: 2 [IU] via SUBCUTANEOUS
  Administered 2023-02-11: 3 [IU] via SUBCUTANEOUS
  Filled 2023-02-10 (×2): qty 1

## 2023-02-10 MED ORDER — INSULIN ASPART 100 UNIT/ML IJ SOLN
5.0000 [IU] | Freq: Once | INTRAMUSCULAR | Status: AC
  Administered 2023-02-10: 5 [IU] via SUBCUTANEOUS
  Filled 2023-02-10: qty 1

## 2023-02-10 MED ORDER — ASPIRIN 81 MG PO TBEC
81.0000 mg | DELAYED_RELEASE_TABLET | Freq: Every day | ORAL | Status: DC
  Administered 2023-02-11: 81 mg via ORAL
  Filled 2023-02-10: qty 1

## 2023-02-10 MED ORDER — ASPIRIN 300 MG RE SUPP
300.0000 mg | Freq: Once | RECTAL | Status: AC
  Administered 2023-02-10: 300 mg via RECTAL
  Filled 2023-02-10: qty 1

## 2023-02-10 MED ORDER — ACETAMINOPHEN 650 MG RE SUPP: 650.0000 mg | RECTAL | Status: DC | PRN

## 2023-02-10 MED ORDER — SODIUM CHLORIDE 0.9% FLUSH: 3.0000 mL | Freq: Once | INTRAVENOUS | Status: DC

## 2023-02-10 MED ORDER — STROKE: EARLY STAGES OF RECOVERY BOOK: Freq: Once | Status: AC

## 2023-02-10 MED ORDER — ACETAMINOPHEN 160 MG/5ML PO SOLN: 650.0000 mg | ORAL | Status: DC | PRN

## 2023-02-10 MED ORDER — INSULIN GLARGINE-YFGN 100 UNIT/ML ~~LOC~~ SOLN
35.0000 [IU] | Freq: Every day | SUBCUTANEOUS | Status: DC
  Administered 2023-02-10: 35 [IU] via SUBCUTANEOUS
  Filled 2023-02-10 (×2): qty 0.35

## 2023-02-10 NOTE — ED Notes (Signed)
Code  stroke  called  to  [facility] 

## 2023-02-10 NOTE — Progress Notes (Signed)
   02/10/23 1100  Spiritual Encounters  Type of Visit Initial  Care provided to: Pt and family  Referral source Code page  Reason for visit Code (Stroke)  OnCall Visit Yes   Chaplain responded to code stroke. Visited with patient and family at bedside. Presented with compassionate presence, reflective listening and empathy. Chaplain spiritual support services remain available as the need arises.

## 2023-02-10 NOTE — Progress Notes (Signed)
CODE STROKE- PHARMACY COMMUNICATION   Time CODE STROKE called/page received: 1013  Time response to CODE STROKE was made (in person or via phone): Immediately, in person  Time Stroke Kit retrieved from Pyxis (only if needed): No thrombolytic per neurologist  Name of Provider/Nurse contacted: Dr. Selina Cooley  Past Medical History:  Diagnosis Date   Diabetes mellitus without complication (HCC)    Hypertension    Stroke [facility])    Prior to Admission medications   Medication Sig Start Date End Date Taking? Authorizing Provider  aspirin EC 81 MG tablet Take 81 mg by mouth daily.    [provider]  clopidogrel (PLAVIX) 75 MG tablet Take 1 tablet (75 mg total) by mouth daily with breakfast. 06/04/17   Enedina Finner, MD  gabapentin (NEURONTIN) 300 MG capsule Take 300 mg by mouth 3 (three) times daily as needed. 03/18/17   [provider]  glipiZIDE (GLUCOTROL) 10 MG tablet Take 10 mg by mouth daily before breakfast.    [provider]  ibuprofen (ADVIL) 600 MG tablet Take 1 tablet (600 mg total) by mouth every 8 (eight) hours as needed. 05/30/19   Shaune Pollack, MD  losartan (COZAAR) 25 MG tablet Take 25 mg by mouth daily.    [provider]  metFORMIN (GLUCOPHAGE) 500 MG tablet Take 1 tablet (500 mg total) by mouth 2 (two) times daily with a meal. Start taking it from Jan 5th saturday 06/03/17   Enedina Finner, MD  metoprolol tartrate (LOPRESSOR) 25 MG tablet Take 1 tablet (25 mg total) by mouth 2 (two) times daily. 06/03/17   Enedina Finner, MD  ondansetron (ZOFRAN ODT) 4 MG disintegrating tablet Take 1 tablet (4 mg total) by mouth every 8 (eight) hours as needed for nausea or vomiting. 05/30/19   Shaune Pollack, MD  simvastatin (ZOCOR) 40 MG tablet Take 1 tablet (40 mg total) by mouth at bedtime. 06/03/17   Enedina Finner, MD    Will Judie Petit. Dareen Piano, PharmD Clinical Pharmacist 02/10/2023 10:26 AM

## 2023-02-10 NOTE — Consult Note (Signed)
NEUROLOGY CONSULTATION NOTE   Date of service: February 10, 2023 Patient Name: Peter Galloway MRN:  [id number] DOB:  [birth year] Reason for consult: stroke code Requesting physician: Dr. 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  He woke up with the symptoms last known well was 11:30 PM prior to going to sleep.  He also endorses a mild headache and dizziness.  On exam he has dysarthria and left nasolabial fold flattening.  He has no drift in any extremity however his left grip is weaker than his right.  Stroke scale was 2 for dysarthria and facial droop.  CT head showed no acute intracranial process on personal review.  TNK was not administered due to presentation outside the window.  CTA H&N radiology interpretation:  1. Occlusion of the left A1 segment and likely the left A2 segment. This may be acute. 2. Occlusion of the right MCA. This is of uncertain chronicity given reconstitution at the right M1/M2 junction and prior infarct in the right MCA territory. Severe focal stenosis in an M2 segment of the right MCA. Decreased arborization in the right MCA territory. 3. Moderate narrowing of the supraclinoid ICA on the left. 4. No hemodynamically significant stenosis in the neck. 5. No acute infarct on perfusion imaging.  I personally reviewed all CNS imaging and agree with the above interpretations with the caveat that given patient has no sx that localize to the L ACA these occlusions are felt to be chronic. There is no indication to consider intervention in this patient given mild sx that do not localize to L ACA.  I evaluated him a few hours later this afternoon and he had no new  neurologic complaints but his dysarthria had improved. NIHSS remained a 2.  ROS   Per HPI: all other systems reviewed and are negative  Past History   I have reviewed the following:  Past Medical History:  Diagnosis Date   Diabetes mellitus without complication (HCC)    Hypertension    Stroke [facility])    Past Surgical History:  Procedure Laterality Date   APPENDECTOMY     CORONARY STENT INTERVENTION N/A 06/02/2017   Procedure: CORONARY STENT INTERVENTION;  Surgeon: Alwyn Pea, MD;  Location: [facility] INVASIVE CV LAB;  Service: Cardiovascular;  Laterality: N/A;   HERNIA REPAIR     LEFT HEART CATH AND CORONARY ANGIOGRAPHY N/A 06/02/2017   Procedure: LEFT HEART CATH AND CORONARY ANGIOGRAPHY;  Surgeon: Alwyn Pea, MD;  Location: [facility] INVASIVE CV LAB;  Service: Cardiovascular;  Laterality: N/A;   History reviewed. No pertinent family history. Social History   Socioeconomic History   Marital status: Single    Spouse name: Not on file   Number of children: Not on file   Years of education: Not on file   Highest education level: Not on file  Occupational History   Not on file  Tobacco Use   Smoking status: Former    Types: Cigarettes   Smokeless tobacco: Never  Substance and Sexual Activity   Alcohol use: No   Drug use: No   Sexual activity: Not on file  Other Topics Concern  Not on file  Social History Narrative   Not on file   Social Determinants of Health   Financial Resource Strain: Not on file  Food Insecurity: No Food Insecurity (02/10/2023)   Hunger Vital Sign    Worried About Running Out of Food in the Last Year: Never true    Ran Out of Food in the Last Year: Never true  Transportation Needs: No Transportation Needs (02/10/2023)   PRAPARE - Administrator, Civil Service (Medical): No    Lack of Transportation (Non-Medical): No  Physical Activity: Not on file  Stress: Not on file  Social Connections: Not on file   Allergies  Allergen  Reactions   Lisinopril Cough    Other reaction(s): OTHER     Medications   Medications Prior to Admission  Medication Sig Dispense Refill Last Dose   aspirin EC 81 MG tablet Take 81 mg by mouth daily.   02/09/2023   clopidogrel (PLAVIX) 75 MG tablet Take 1 tablet (75 mg total) by mouth daily with breakfast. 30 tablet 3 02/09/2023   gabapentin (NEURONTIN) 300 MG capsule Take 300 mg by mouth 3 (three) times daily as needed.  4 02/09/2023   glipiZIDE (GLUCOTROL) 10 MG tablet Take 10 mg by mouth daily before breakfast.   02/09/2023   Insulin Aspart FlexPen (NOVOLOG) 100 UNIT/ML Inject 10-50 Units into the skin 3 (three) times daily before meals.   02/09/2023   Insulin Glargine (BASAGLAR KWIKPEN) 100 UNIT/ML Inject 40-50 Units into the skin at bedtime.   02/09/2023   JARDIANCE 25 MG TABS tablet Take 25 mg by mouth daily.   02/09/2023   losartan (COZAAR) 100 MG tablet Take 100 mg by mouth daily.   02/09/2023   metFORMIN (GLUCOPHAGE) 1000 MG tablet Take 1 tablet by mouth 2 (two) times daily with a meal.   02/09/2023   metoprolol tartrate (LOPRESSOR) 50 MG tablet Take 1 tablet by mouth 2 (two) times daily.   02/09/2023   rosuvastatin (CRESTOR) 20 MG tablet Take 20 mg by mouth at bedtime.   02/09/2023      Current Facility-Administered Medications:    [START ON 02/11/2023]  stroke: early stages of recovery book, , Does not apply, Once, Lurene Shadow, MD   acetaminophen (TYLENOL) tablet 650 mg, 650 mg, Oral, Q4H PRN **OR** acetaminophen (TYLENOL) 160 MG/5ML solution 650 mg, 650 mg, Per Tube, Q4H PRN **OR** acetaminophen (TYLENOL) suppository 650 mg, 650 mg, Rectal, Q4H PRN, Lurene Shadow, MD   enoxaparin (LOVENOX) injection 40 mg, 40 mg, Subcutaneous, Q24H, Lurene Shadow, MD   [START ON 02/11/2023] influenza vac split trivalent PF (FLULAVAL) injection 0.5 mL, 0.5 mL, Intramuscular, Tomorrow-1000, Lurene Shadow, MD   [START ON 02/11/2023] pneumococcal 20-valent conjugate vaccine (PREVNAR 20) injection 0.5  mL, 0.5 mL, Intramuscular, Tomorrow-1000, Ayiku, Bernard, MD   sodium chloride flush (NS) 0.9 % injection 3 mL, 3 mL, Intravenous, Once, Lurene Shadow, MD  Vitals   Vitals:   02/10/23 1018 02/10/23 1230  BP: (!) 196/109 (!) 162/88  Pulse: 97 72  Resp: 20 20  Temp: 98 F (36.7 C) 98.1 F (36.7 C)  TempSrc: Oral Oral  SpO2: 99% 99%  Weight: 88 kg   Height: 5\' 9"  (1.753 m)      Body mass index is 28.65 kg/m.  Physical Exam   Physical Exam Gen: A&O x4, NAD HEENT: Atraumatic, normocephalic;mucous membranes moist; oropharynx clear, tongue without atrophy or fasciculations. Neck: Supple, trachea midline. Resp: CTAB, no w/r/r CV: RRR, no m/g/r;  nml S1 and S2. 2+ symmetric peripheral pulses. Abd: soft/NT/ND; nabs x 4 quad Extrem: Nml bulk; no cyanosis, clubbing, or edema.  Neuro: *MS: A&O x4. Follows multi-step commands.  *Speech: fluid, mildly dysarthric, able to name and repeat *CN:    I: Deferred   II,III: PERRLA, VFF by confrontation, optic discs unable to be visualized 2/2 pupillary constriction   III,IV,VI: EOMI w/o nystagmus, no ptosis   V: Sensation intact from V1 to V3 to LT   VII: Eyelid closure was full.  L NLF flattening   VIII: Hearing intact to voice   IX,X: Voice normal, palate elevates symmetrically    XI: SCM/trap 5/5 bilat   XII: Tongue protrudes midline, no atrophy or fasciculations  *Motor:   Normal bulk.  No tremor, rigidity or bradykinesia. No drift in any extremity although his grip strength is slightly weaker on the L than the right *Sensory: Intact to light touch, pinprick, temperature vibration throughout. Symmetric. Propioception intact bilat.  No double-simultaneous extinction.  *Coordination:  dysmetria and intention tremor on FNF bilat without frank ataxia *Reflexes:  1+ and symmetric throughout without clonus; toes down-going bilat *Gait: deferred  NIHSS = 2 for dysarthria and facial droop   Labs   CBC:  Recent Labs  Lab 02/10/23 1036   WBC 7.2  NEUTROABS 5.1  HGB 14.8  HCT 43.8  MCV 95.4  PLT 144*    Basic Metabolic Panel:  Lab Results  Component Value Date   NA 136 02/10/2023   K 4.6 02/10/2023   CO2 22 02/10/2023   GLUCOSE 116 (H) 02/10/2023   BUN 26 (H) 02/10/2023   CREATININE 1.12 02/10/2023   CALCIUM 8.8 (L) 02/10/2023   GFRNONAA >60 02/10/2023   GFRAA >60 05/30/2019   Lipid Panel:  Lab Results  Component Value Date   LDLCALC 36 02/10/2023   HgbA1c: No results found for: "HGBA1C" Urine Drug Screen: No results found for: "LABOPIA", "COCAINSCRNUR", "LABBENZ", "AMPHETMU", "THCU", "LABBARB"  Alcohol Level     Component Value Date/Time   ETH <10 02/10/2023 1036    CT Head without contrast: No acute process personal review  CTA H&N 1. Occlusion of the left A1 segment and likely the left A2 segment. This may be acute. 2. Occlusion of the right MCA. This is of uncertain chronicity given reconstitution at the right M1/M2 junction and prior infarct in the right MCA territory. Severe focal stenosis in an M2 segment of the right MCA. Decreased arborization in the right MCA territory. 3. Moderate narrowing of the supraclinoid ICA on the left. 4. No hemodynamically significant stenosis in the neck. 5. No acute infarct on perfusion imaging.  I personally reviewed all CNS imaging and agree with the above interpretations with the caveat that given patient has no sx that localize to the L ACA these occlusions are felt to be chronic.  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  His sx, while improving, are c/f recurrent acute ischemic stroke.  Recommendations   - Admit for stroke workup - Permissive HTN x48 hrs from sx onset or until stroke ruled out by MRI goal BP <220/110. PRN  labetalol or hydralazine if BP above these parameters. Avoid oral antihypertensives. - MRI brain wo contrast - TTE w/ bubble - Check A1c and LDL + add statin per guidelines -  Continue home aspirin 81mg  and plavix 75mg  daily - q4 hr neuro checks - STAT head CT for any change in neuro exam - Tele - PT/OT/SLP - Stroke education - Amb referral to neurology upon discharge    I will continue to follow. ______________________________________________________________________   Thank you for the opportunity to take part in the care of this patient. If you have any further questions, please contact the neurology consultation attending.  Signed,  Bing Neighbors, MD [facility] [phone]  If 7pm- 7am, please page neurology on call as listed in AMION.  **Any copied and pasted documentation in this note was written by me in another application not billed for and pasted by me into this document.

## 2023-02-10 NOTE — ED Triage Notes (Signed)
Pt from home POV- states he went to bed last night around 1130pm normal and woke this am at 0700 with facial droop, left sided weakness, headache and slurred speech.

## 2023-02-10 NOTE — ED Provider Notes (Signed)
[facility] Provider Note    Event Date/Time   First MD Initiated Contact with Patient 02/10/23 1012     (approximate)   History   Chief Complaint: Code Stroke   HPI  Peter Galloway is a 52 y.o. male with a past history of hypertension diabetes and CAD who is brought to the ED today due to waking up this morning with facial droop, slurred speech, left-sided weakness.  Also endorses headache, no trauma or fever.  No vision change.  Last known well was bedtime at 11:30 PM last night.     Physical Exam   Triage Vital Signs: ED Triage Vitals [02/10/23 1018]  Encounter Vitals Group     BP (!) 196/109     Systolic BP Percentile      Diastolic BP Percentile      Pulse Rate 97     Resp 20     Temp 98 F (36.7 C)     Temp Source Oral     SpO2 99 %     Weight 194 lb 0.1 oz (88 kg)     Height 5\' 9"  (1.753 m)     Head Circumference      Peak Flow      Pain Score 8     Pain Loc      Pain Education      Exclude from Growth Chart     Most recent vital signs: Vitals:   02/10/23 1018  BP: (!) 196/109  Pulse: 97  Resp: 20  Temp: 98 F (36.7 C)  SpO2: 99%    General: Awake, no distress.  CV:  Good peripheral perfusion.  Resp:  Normal effort.  Abd:  No distention.  Other:  Dysarthria, facial droop.  Motor function is symmetric.  NIH stroke scale 2   ED Results / Procedures / Treatments   Labs (all labs ordered are listed, but only abnormal results are displayed) Labs Reviewed  CBG MONITORING, ED - Abnormal; Notable for the following components:      Result Value   Glucose-Capillary 132 (*)    All other components within normal limits  PROTIME-INR  APTT  CBC  DIFFERENTIAL  COMPREHENSIVE METABOLIC PANEL  ETHANOL     EKG Interpreted by me Sinus rhythm rate of 77.  Normal axis intervals QRS ST segments and T waves.   RADIOLOGY CT head interpreted by me, appears normal.  Radiology report  reviewed   PROCEDURES:  Procedures   MEDICATIONS ORDERED IN ED: Medications  sodium chloride flush (NS) 0.9 % injection 3 mL (3 mLs Intravenous Not Given 02/10/23 1039)  aspirin suppository 300 mg (has no administration in time range)  iohexol (OMNIPAQUE) 350 MG/ML injection 100 mL (100 mLs Intravenous Contrast Given 02/10/23 1030)     IMPRESSION / MDM / ASSESSMENT AND PLAN / ED COURSE  I reviewed the triage vital signs and the nursing notes.  DDx: Acute ischemic stroke, intracranial hemorrhage, AKI, alcohol intoxication  Patient's presentation is most consistent with acute presentation with potential threat to life or bodily function.  Patient presents with dysarthria and facial droop, concerning for ischemic stroke.  CT head negative.  Discussed with neurology Dr. Selina Cooley who advises CT angiogram appears unremarkable, suspects small brainstem stroke as cause of his symptoms.  Recommends hospitalization for further stroke workup.       FINAL CLINICAL IMPRESSION(S) / ED DIAGNOSES   Final diagnoses:  Acute ischemic stroke (HCC)     Rx / DC Orders  ED Discharge Orders     None        Note:  This document was prepared using Dragon voice recognition software and may include unintentional dictation errors.   Sharman Cheek, MD 02/10/23 1050

## 2023-02-10 NOTE — Evaluation (Signed)
Clinical/Bedside Swallow Evaluation Patient Details  Name: Peter Galloway MRN: [id number] Date of Birth: 08/20/1970  Today's Date: 02/10/2023 Time: SLP Start Time (ACUTE ONLY): 1430 SLP Stop Time (ACUTE ONLY): 1530 SLP Time Calculation (min) (ACUTE ONLY): 60 min  Past Medical History:  Past Medical History:  Diagnosis Date   Diabetes mellitus without complication (HCC)    Hypertension    Stroke (HCC)    Past Surgical History:  Past Surgical History:  Procedure Laterality Date   APPENDECTOMY     CORONARY STENT INTERVENTION N/A 06/02/2017   Procedure: CORONARY STENT INTERVENTION;  Surgeon: Alwyn Pea, MD;  Location: [facility] INVASIVE CV LAB;  Service: Cardiovascular;  Laterality: N/A;   HERNIA REPAIR     LEFT HEART CATH AND CORONARY ANGIOGRAPHY N/A 06/02/2017   Procedure: LEFT HEART CATH AND CORONARY ANGIOGRAPHY;  Surgeon: Alwyn Pea, MD;  Location: [facility] INVASIVE CV LAB;  Service: Cardiovascular;  Laterality: N/A;   HPI:  Pt is a 52 y.o. 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  Pt endorses his s/s of "better".   Head CT: Chronic right MCA territory infarct, unchanged from  05/17/2015. no hemorrhage. No hydrocephalus. No extra-axial fluid  collection. Background of moderate chronic microvascular ischemic  change. No CT evidence of an acute cortical infarct.    Assessment / Plan / Recommendation  Clinical Impression   Pt seen for BSE and informal assessment of Dysarthria at bedside today. Pt awake, verbal and A/O x3. Noted rushed speech, Dysarthria+. Pt followed all instructions, fed self. Pt stated eagerness  for "something to eat - I haven't had any lunch". On RA, afebrile. WBC WNL.  Pt appears to present w/ functional oropharyngeal phase swallowing w/ No oropharyngeal phase dysphagia noted, No sensorimotor deficits noted. Pt consumed po trials w/ No overt, clinical s/s of aspiration during trials. Pt appears at reduced risk for aspiration when following general aspiration precautions w/ oral intake. Pt does exhibit Dysarthria during conversation/speech.  During po trials, pt consumed all consistencies w/ no overt coughing, decline in vocal quality, or change in respiratory presentation during/post trials. O2 sats 99% when checked. Oral phase appeared [facility] w/ timely bolus management, mastication, and control of bolus propulsion for A-P transfer for swallowing. Oral clearing achieved w/ all trial consistencies -- moistened, soft foods given.  OM Exam appeared grossly [facility] w/ no unilateral lingual/labial weakness noted. Min decreased motor coordination noted in rapid OM movements. Pt fed self sitting EOB.  Recommend a Regular consistency diet w/ well-Cut meats, moistened foods; Thin liquids. Recommend general aspiration precautions, reflux precautions. Reduce distractions, talking during oral intake. Pills 1 at a time w/ liquids vs WHOLE in Puree for safer, easier swallowing if needed per NSG. Education given on Pills in Puree; food consistencies and easy to eat options; general aspiration precautions to pt. No further skilled ST services indicated for swallowing. Noted the Ninfa Linden was not passed d/t pt stopping drinking -- Slow, Small sips are encouraged for general safety of swallowing. Pt agreed. NSG to reconsult if any new swallowing needs arise. MD/NSG updated, agreed.   Patient completed informal speech/fluency and cognitive-linguistic evaluation today. Parts  of the Bedside Western Aphasia Battery (B-WAB) and informal Cognitive screen given w/ functional Expressive and Receptive language skills noted; Fluency,  Auditory Verbal Comprehension, Sequential Commands, Repetition, and Naming were all [facility] w/ no overt deficits noted. Cognitive tasks appeared [facility] for attention, orientation, basic calculation, abstraction, and problem-solving.  However, noted deficits in patient's motor speech and articulation of speech sounds: deficits c/b rushed speech, dysfluency intermittently, and imprecise articulation of speech. Min breathiness noted; lack of full breath support w/ lengthier sentences and speech noted. Possible min+ difficulty w/ Verbal Apraxia tasks w/ consonant placements and 3 syllable connected sounds - decreased coordination in rapid motor movements/speech. Pt stated this was "like I talked during my stroke ~15 years ago". 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    SLP provided education re: importance of skilled ST services to address speech-related strategies to combat the "slurred" speech/Dysarthria. Pt educated on and modeled/practiced speech/articulatory strategies to include: increasing loudness(w/ appropriate breath support), Slowing rate of speech, and using bigger, over-articulated speech. These were practiced w/ Menu reading and automatic speech tasks. Patient exhibited improved speech intelligibility and precision of speech sounds when utilizing the strategies; pt noted the difference as well.  Education and handouts given on the strategies; practiced w/ pt who was able to follow through w/ 100% accuracy when he "remembered to slow down - I talk fast, you know".  No further Acute ST services indicated --  recommend f/u skilled ST services post D/C to address patient's Dysarthria and speech-related goals using articulatory strategies to increase overall intelligibility, communication effectiveness in ADLs, and quality of life. MD and Team updated. Questions answered; handouts given.  SLP Visit Diagnosis: Dysphagia, unspecified (R13.10);Dysarthria and anarthria  (R47.1)    Aspiration Risk   (reduced following general aspiration precautions)    Diet Recommendation   Thin;Age appropriate regular (cut meats, moistened foods) = a Regular consistency diet w/ well-Cut meats, moistened foods; Thin liquids. Recommend general aspiration precautions, reflux precautions. Reduce distractions, talking during oral intake.   Medication Administration: Whole meds with liquid (vs whole in puree if needed)    Other  Recommendations Recommended Consults:  (Dietician f/u) Oral Care Recommendations: Oral care BID;Patient independent with oral care    Recommendations for follow up therapy are one component of a multi-disciplinary discharge planning process, led by the attending physician.  Recommendations may be updated based on patient status, additional functional criteria and insurance authorization.  Follow up Recommendations Outpatient SLP (for Dysarthria)      Assistance Recommended at Discharge  intermittent  Functional Status Assessment Patient has had a recent decline in their functional status and demonstrates the ability to make significant improvements in function in a reasonable and predictable amount of time.  Frequency and Duration  (n/a)   (n/a)       Prognosis Prognosis for improved oropharyngeal function: Good Barriers to Reach Goals: Time post onset;Severity of deficits Barriers/Prognosis Comment: Dysarthria - Outpt f/u      Swallow Study   General Date of Onset: 02/10/23 HPI: Pt is a 52 y.o. 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  Pt endorses his s/s  of "better".    Head CT: Chronic right MCA territory infarct, unchanged from  05/17/2015. no hemorrhage. No hydrocephalus. No extra-axial fluid  collection. Background of moderate chronic microvascular ischemic  change. No CT evidence of an acute cortical infarct. Type of Study: Bedside Swallow Evaluation Previous Swallow Assessment: none per epic Diet Prior to this Study: NPO Temperature Spikes Noted: No (wbc 7.2) Respiratory Status: Room air History of Recent Intubation: No Behavior/Cognition: Alert;Cooperative;Pleasant mood (Dysarthria) Oral Cavity Assessment: Within Functional Limits Oral Care Completed by SLP: Yes Oral Cavity - Dentition: Adequate natural dentition Vision: Functional for self-feeding Self-Feeding Abilities: Able to feed self Patient Positioning: Upright in bed (EOB) Baseline Vocal Quality: Normal Volitional Cough: Strong Volitional Swallow: Able to elicit    Oral/Motor/Sensory Function Overall Oral Motor/Sensory Function: Within functional limits (mild decreased coordination in rapid movements)   Ice Chips Ice chips: Within functional limits Presentation: Spoon (fed; 2 trials)   Thin Liquid Thin Liquid: Within functional limits Presentation: Cup;Self Fed;Straw (~10 ozs)    Nectar Thick Nectar Thick Liquid: Not tested   Honey Thick Honey Thick Liquid: Not tested   Puree Puree: Within functional limits Presentation: Self Fed;Spoon (~3 ozs)   Solid     Solid: Within functional limits Presentation: Self Fed (10+ trials)          Jerilynn Som, MS, CCC-SLP Speech Language Pathologist Rehab Services; [facility] [place] [phone] (ascom) Lucas Exline 02/10/2023,5:14 PM

## 2023-02-10 NOTE — ED Notes (Signed)
Dr Scotty Court notified failed swallow screen

## 2023-02-10 NOTE — H&P (Addendum)
History and Physical:    Peter Galloway   TKZ:[id number] DOB: 1971/04/02 DOA: 02/10/2023  Referring MD/provider: Alfonse Flavors, MD PCP: Pcp, No   Patient coming from: Home  Chief Complaint: Slurred speech  History of Present Illness:   Peter Galloway is a 51 y.o. male with medical history significant for hypertension, type 2 diabetes mellitus, CKD stage II, history of stroke with left-sided weakness, former smoker (quit more than 10 years ago), CAD with previous NSTEMI s/p RCA stent in January 2019, hyperlipidemia, who presented to the hospital with slurred speech, facial droop and left-sided numbness affecting the entire left upper extremity and toes on the left foot.Marland Kitchen  He said he has a history of stroke 13 years ago and he had residual left-sided numbness from the stroke. He said he had a glass of wine with sweet tea last night. Last known normal was around 11:30 PM last night.  He fell asleep and did not even finish his drink.   He woke up this morning and noticed that he had left-sided numbness and slurred speech.  His gait was unsteady and he had to hold onto furniture to get around the house.  No headache, confusion, change in vision, vomiting, diarrhea, abdominal pain, shortness of breath or chest pain.  ED Course:  The patient was evaluated by Dr. Scotty Court, ED physician and he discussed the case with Dr. Selina Cooley, neurologist.  CT head did not show any acute stroke or abnormality but CTA head and neck showed occlusion of the left A1 segment and likely the left A2 segment which is suspected to be acute, occlusion of right MCA of uncertain chronicity.  Patient was deemed not to be a candidate for tPA.   ROS:   ROS all other systems reviewed were negative  Past Medical History:   Past Medical History:  Diagnosis Date   Diabetes mellitus without complication (HCC)    Hypertension    Stroke [facility])     Past Surgical History:   Past Surgical History:  Procedure Laterality Date    APPENDECTOMY     CORONARY STENT INTERVENTION N/A 06/02/2017   Procedure: CORONARY STENT INTERVENTION;  Surgeon: Alwyn Pea, MD;  Location: [facility] INVASIVE CV LAB;  Service: Cardiovascular;  Laterality: N/A;   HERNIA REPAIR     LEFT HEART CATH AND CORONARY ANGIOGRAPHY N/A 06/02/2017   Procedure: LEFT HEART CATH AND CORONARY ANGIOGRAPHY;  Surgeon: Alwyn Pea, MD;  Location: [facility] INVASIVE CV LAB;  Service: Cardiovascular;  Laterality: N/A;    Social History:   Social History   Socioeconomic History   Marital status: Single    Spouse name: Not on file   Number of children: Not on file   Years of education: Not on file   Highest education level: Not on file  Occupational History   Not on file  Tobacco Use   Smoking status: Former    Types: Cigarettes   Smokeless tobacco: Never  Substance and Sexual Activity   Alcohol use: No   Drug use: No   Sexual activity: Not on file  Other Topics Concern   Not on file  Social History Narrative   Not on file   Social Determinants of Health   Financial Resource Strain: Not on file  Food Insecurity: Not on file  Transportation Needs: Not on file  Physical Activity: Not on file  Stress: Not on file  Social Connections: Not on file  Intimate Partner Violence: Not on file    Allergies  Lisinopril  Family history:   History reviewed. No pertinent family history.  Current Medications:   Prior to Admission medications   Medication Sig Start Date End Date Taking? Authorizing Provider  aspirin EC 81 MG tablet Take 81 mg by mouth daily.    [provider]  clopidogrel (PLAVIX) 75 MG tablet Take 1 tablet (75 mg total) by mouth daily with breakfast. 06/04/17   Enedina Finner, MD  gabapentin (NEURONTIN) 300 MG capsule Take 300 mg by mouth 3 (three) times daily as needed. 03/18/17   [provider]  glipiZIDE (GLUCOTROL) 10 MG tablet Take 10 mg by mouth daily before breakfast.    [provider]  ibuprofen  (ADVIL) 600 MG tablet Take 1 tablet (600 mg total) by mouth every 8 (eight) hours as needed. 05/30/19   Shaune Pollack, MD  losartan (COZAAR) 25 MG tablet Take 25 mg by mouth daily.    [provider]  metFORMIN (GLUCOPHAGE) 500 MG tablet Take 1 tablet (500 mg total) by mouth 2 (two) times daily with a meal. Start taking it from Jan 5th saturday 06/03/17   Enedina Finner, MD  metoprolol tartrate (LOPRESSOR) 25 MG tablet Take 1 tablet (25 mg total) by mouth 2 (two) times daily. 06/03/17   Enedina Finner, MD  ondansetron (ZOFRAN ODT) 4 MG disintegrating tablet Take 1 tablet (4 mg total) by mouth every 8 (eight) hours as needed for nausea or vomiting. 05/30/19   Shaune Pollack, MD  simvastatin (ZOCOR) 40 MG tablet Take 1 tablet (40 mg total) by mouth at bedtime. 06/03/17   Enedina Finner, MD    Physical Exam:   Vitals:   02/10/23 1018  BP: (!) 196/109  Pulse: 97  Resp: 20  Temp: 98 F (36.7 C)  TempSrc: Oral  SpO2: 99%  Weight: 88 kg  Height: 5\' 9"  (1.753 m)     Physical Exam: Blood pressure (!) 196/109, pulse 97, temperature 98 F (36.7 C), temperature source Oral, resp. rate 20, height 5\' 9"  (1.753 m), weight 88 kg, SpO2 99%. Gen: No acute distress. Head: Normocephalic, atraumatic. Eyes: Pupils equal, round and reactive to light. Extraocular movements intact.  Sclerae nonicteric.  Mouth: Moist mucous membranes.  No pharyngeal exudates or erythema Neck: Supple, no thyromegaly, no lymphadenopathy, no jugular venous distention. Chest: Lungs are clear to auscultation with good air movement. No rales, rhonchi or wheezes.  CV: Heart sounds are regular with an S1, S2. No murmurs, rubs or gallops.  Abdomen: Soft, nontender, nondistended with normal active bowel sounds. No palpable masses. Extremities: Extremities are without clubbing, or cyanosis. No edema. Pedal pulses 2+.  Skin: Warm and dry. No rashes, lesions or wounds Neuro: Alert and oriented times 3; slurred speech,left facial droop.   Power in bilateral upper and lower extremities are normal. Psych: Insight is good and judgment is appropriate. Mood and affect normal.   NIHSS is 2 for slurred speech and facial droop    Data Review:    Labs: Basic Metabolic Panel: Recent Labs  Lab 02/10/23 1036  NA 136  K 4.6  CL 105  CO2 22  GLUCOSE 116*  BUN 26*  CREATININE 1.12  CALCIUM 8.8*   Liver Function Tests: Recent Labs  Lab 02/10/23 1036  AST 26  ALT 29  ALKPHOS 56  BILITOT 0.6  PROT 6.4*  ALBUMIN 3.6   No results for input(s): "LIPASE", "AMYLASE" in the last 168 hours. No results for input(s): "AMMONIA" in the last 168 hours. CBC: Recent Labs  Lab 02/10/23 1036  WBC 7.2  NEUTROABS 5.1  HGB 14.8  HCT 43.8  MCV 95.4  PLT 144*   Cardiac Enzymes: No results for input(s): "CKTOTAL", "CKMB", "CKMBINDEX", "TROPONINI" in the last 168 hours.  BNP (last 3 results) No results for input(s): "PROBNP" in the last 8760 hours. CBG: Recent Labs  Lab 02/10/23 1012  GLUCAP 132*    Urinalysis    Component Value Date/Time   COLORURINE YELLOW (A) 01/09/2021 1113   APPEARANCEUR CLEAR (A) 01/09/2021 1113   APPEARANCEUR Clear 08/31/2013 0034   LABSPEC 1.015 01/09/2021 1113   LABSPEC 1.026 08/31/2013 0034   PHURINE 5.0 01/09/2021 1113   GLUCOSEU NEGATIVE 01/09/2021 1113   GLUCOSEU >=500 08/31/2013 0034   HGBUR MODERATE (A) 01/09/2021 1113   BILIRUBINUR NEGATIVE 01/09/2021 1113   BILIRUBINUR Negative 08/31/2013 0034   KETONESUR NEGATIVE 01/09/2021 1113   PROTEINUR 30 (A) 01/09/2021 1113   NITRITE NEGATIVE 01/09/2021 1113   LEUKOCYTESUR NEGATIVE 01/09/2021 1113   LEUKOCYTESUR Negative 08/31/2013 0034      Radiographic Studies: CT ANGIO HEAD NECK W WO CM W PERF (CODE STROKE)  Result Date: 02/10/2023 CLINICAL DATA:  Neuro deficit, acute, stroke suspected EXAM: CT ANGIOGRAPHY HEAD AND NECK CT PERFUSION BRAIN TECHNIQUE: Multidetector CT imaging of the head and neck was performed using the standard  protocol during bolus administration of intravenous contrast. Multiplanar CT image reconstructions and MIPs were obtained to evaluate the vascular anatomy. Carotid stenosis measurements (when applicable) are obtained utilizing NASCET criteria, using the distal internal carotid diameter as the denominator. Multiphase CT imaging of the brain was performed following IV bolus contrast injection. Subsequent parametric perfusion maps were calculated using RAPID software. RADIATION DOSE REDUCTION: This exam was performed according to the departmental dose-optimization program which includes automated exposure control, adjustment of the mA and/or kV according to patient size and/or use of iterative reconstruction technique. CONTRAST:  OMNIPAQUE IOHEXOL 350 MG/ML SOLN COMPARISON:  Brain MR 05/17/15 FINDINGS: See same day CT head for intracranial findings. CTA NECK FINDINGS Aortic arch: Four-vessel aortic arch. Imaged portion shows no evidence of aneurysm or dissection. No significant stenosis of the major arch vessel origins. Right carotid system: No evidence of dissection, stenosis (50% or greater) or occlusion. Left carotid system: No evidence of dissection, stenosis (50% or greater) or occlusion. Vertebral arteries: Codominant. No evidence of dissection, stenosis (50% or greater) or occlusion. Skeleton: Negative Other neck: Negative Upper chest: Negative Review of the MIP images confirms the above findings CTA HEAD FINDINGS Anterior circulation: The right MCA is occluded. This is of uncertain chronicity given reconstitution of the right M1 M2 junction. There is severe focal stenosis in an M2 segment of the right MCA (series 7, image 103). There is decreased arborization in the right MCA territory. The left A1 segment is occluded (series 8, image 83). The left A2 segment is likely also occluded (series 8, image 72). Moderate narrowing of the supraclinoid ICA on the left. Posterior circulation: No significant  stenosis, proximal occlusion, aneurysm, or vascular malformation. Venous sinuses: As permitted by contrast timing, patent. Anatomic variants: None Review of the MIP images confirms the above findings CT Brain Perfusion Findings: ASPECTS: 10 when accounting for chronic findings. CBF (<30%) Volume: 0mL Perfusion (Tmax>6.0s) volume: 3mL Mismatch Volume: 3mL Infarction Location:No acute infarct. IMPRESSION: 1. Occlusion of the left A1 segment and likely the left A2 segment. This may be acute. 2. Occlusion of the right MCA. This is of uncertain chronicity given reconstitution at the right M1/M2 junction  and prior infarct in the right MCA territory. Severe focal stenosis in an M2 segment of the right MCA. Decreased arborization in the right MCA territory. 3. Moderate narrowing of the supraclinoid ICA on the left. 4. No hemodynamically significant stenosis in the neck. 5. No acute infarct on perfusion imaging. Findings were paged to Dr. Selina Cooley on 02/10/23 at 10:47 AM via [facility] paging system. Electronically Signed   By: Lorenza Cambridge M.D.   On: 02/10/2023 10:51   CT HEAD CODE STROKE WO CONTRAST  Result Date: 02/10/2023 CLINICAL DATA:  Code stroke. Neuro deficit, acute, stroke suspected left-sided weakness EXAM: CT HEAD WITHOUT CONTRAST TECHNIQUE: Contiguous axial images were obtained from the base of the skull through the vertex without intravenous contrast. RADIATION DOSE REDUCTION: This exam was performed according to the departmental dose-optimization program which includes automated exposure control, adjustment of the mA and/or kV according to patient size and/or use of iterative reconstruction technique. COMPARISON:  Brain MR 05/17/15 FINDINGS: Brain: Chronic right MCA territory infarct, unchanged from 05/17/2015. no hemorrhage. No hydrocephalus. No extra-axial fluid collection. Background of moderate chronic microvascular ischemic change. No CT evidence of an acute cortical infarct. Vascular: No hyperdense vessel or  unexpected calcification. Skull: Normal. Negative for fracture or focal lesion. Sinuses/Orbits: No mastoid or middle ear effusion. Mucosal thickening of the floor of bilateral maxillary sinuses. Orbits are unremarkable. Other: None ASPECTS ([place] Stroke Program Early CT Score): 10 when accounting for chronic findings. IMPRESSION: No hemorrhage or CT evidence of an acute cortical infarct. Aspects is 10 when accounting for chronic findings. Findings were paged to Dr. Selina Cooley on 02/10/2023 at 10:30 a.m. via [facility] paging system. Electronically Signed   By: Lorenza Cambridge M.D.   On: 02/10/2023 10:31    EKG: No EKG on chart   Assessment/Plan:   Principal Problem:   Slurred speech    Body mass index is 28.65 kg/m.   Acute stroke with slurred speech and left facial droop and unsteady gait: Admit to MedSurg and monitor on telemetry.  Dr. Selina Cooley, neurologist, has been consulted.  Patient failed swallow screen in the ED.  Keep n.p.o. for now.  Consult PT, OT and ST for further evaluation. He was on Plavix prior to admission.  Will hold for now pending further evaluation by neurologist. Obtain baseline EKG   CAD with stenting RCA in January 2019: He was on Plavix prior to admission.  He said he no longer takes aspirin.   Type II DM: Hold metformin and glipizide.  NovoLog as needed for hyperglycemia.  Continue Insuline Glargine. Check hemoglobin A1c.   Hypertension: Hold losartan and metoprolol.  Will allow permissive hypertension for now.   Hyperlipidemia: He is on simvastatin at home.  Check lipid panel.  Other information:   DVT prophylaxis: Lovenox  Code Status: Full code. Family Communication: None Disposition Plan: Plan to discharge home Consults called: Neurologist Admission status: Observation     Peter Galloway [facility] Pager: Please check [email or web address]   How to contact the [facility] Attending or Consulting provider 7A - 7P or covering provider during after hours 7P  -7A, for this patient?   Check the care team in [facility] and look for a) attending/consulting [facility] provider listed and b) the [facility] team listed Log into [email or web address] and use [place]'s universal password to access. If you do not have the password, please contact the hospital operator. Locate the [facility] provider you are looking for under [facility] and page to a number that you can be directly  reached. If you still have difficulty reaching the provider, please page the [facility] (Director on Call) for the Hospitalists listed on amion for assistance.  02/10/2023, 11:35 AM

## 2023-02-10 NOTE — Evaluation (Signed)
Occupational Therapy Evaluation Patient Details Name: Peter Galloway MRN: [id number] DOB: 1971/03/16 Today's Date: 02/10/2023   History of Present Illness 52 y.o. male with PMHx significant for HTN, type 2 diabetes mellitus, CKD stage II, history of stroke with left-sided weakness, former smoker (quit more than 10 years ago), CAD with previous NSTEMI s/p RCA stent in January 2019, HLD, who presented to the hospital with slurred speech, facial droop and left-sided numbness affecting the entire left upper extremity and toes on the left foot.   Clinical Impression   Pt was seen for OT evaluation this date. Prior to hospital admission, pt reports being independent, working 2 jobs, and driving short distances. Pt lives with his 6 cats in a 1 story home with 2-3 STE with bilateral rails. Denies falls. Pt presents to acute OT demonstrating impaired ADL performance and functional mobility 2/2 mild speech slurring 2/2 mild L facial droop and mild impaired dynamic standing balance (See OT problem list for additional functional deficits). Pt currently requires CGA for ADL mobility without AD and notable for slight LOB but able to self correct, supv for toilet transfer and LB dressing but no direct assist required. Pt denies need for skilled services at this time. Pt appears near baseline for ADL. Do not anticipate the need for follow up OT services upon acute hospital DC.     If plan is discharge home, recommend the following: A little help with walking and/or transfers;A little help with bathing/dressing/bathroom;Assistance with cooking/housework;Assist for transportation;Help with stairs or ramp for entrance    Functional Status Assessment  Patient has had a recent decline in their functional status and demonstrates the ability to make significant improvements in function in a reasonable and predictable amount of time.  Equipment Recommendations  None recommended by OT    Recommendations for Other Services        Precautions / Restrictions Precautions Precautions: Fall Restrictions Weight Bearing Restrictions: No      Mobility Bed Mobility Overal bed mobility: Modified Independent                  Transfers Overall transfer level: Needs assistance Equipment used: None Transfers: Sit to/from Stand Sit to Stand: Supervision                  Balance Overall balance assessment: Needs assistance Sitting-balance support: No upper extremity supported, Feet supported Sitting balance-Leahy Scale: Good     Standing balance support: No upper extremity supported Standing balance-Leahy Scale: Fair Standing balance comment: mildly unsteady particularly with turns or around obstacles                           ADL either performed or assessed with clinical judgement   ADL Overall ADL's : Needs assistance/impaired                     Lower Body Dressing: Sit to/from stand;Supervision/safety   Toilet Transfer: Supervision/safety;Regular Toilet           Functional mobility during ADLs: Contact guard assist       Vision         Perception         Praxis         Pertinent Vitals/Pain Pain Assessment Pain Assessment: No/denies pain     Extremity/Trunk Assessment Upper Extremity Assessment Upper Extremity Assessment: Overall WFL for tasks assessed   Lower Extremity Assessment Lower Extremity Assessment: Defer to PT evaluation;Overall [facility]  for tasks assessed       Communication     Cognition Arousal: Alert Behavior During Therapy: [facility] for tasks assessed/performed Overall Cognitive Status: Within Functional Limits for tasks assessed                                       General Comments       Exercises     Shoulder Instructions      Home Living Family/patient expects to be discharged to:: Private residence Living Arrangements: Alone;Other (Comment) (6 cats) Available Help at Discharge: Family;Available  PRN/intermittently Type of Home: House Home Access: Stairs to enter [employer] of Steps: 2 front, 3 back, bilat rails on front and back   Home Layout: One level     Bathroom Shower/Tub: Chief Strategy Officer: Standard     Home Equipment: Agricultural consultant (2 wheels);Cane - single point          Prior Functioning/Environment Prior Level of Function : Independent/Modified Independent;Working/employed;Driving             Mobility Comments: indep ADLs Comments: works 2 jobs, Engineering geologist, Insurance risk surveyor Problem List: Impaired balance (sitting and/or standing)      OT Treatment/Interventions:      OT Goals(Current goals can be found in the care plan section) Acute Rehab OT Goals Patient Stated Goal: go home OT Goal Formulation: All assessment and education complete, DC therapy  OT Frequency:      Co-evaluation              AM-PAC OT "6 Clicks" Daily Activity     Outcome Measure Help from another person eating meals?: None Help from another person taking care of personal grooming?: None Help from another person toileting, which includes using toliet, bedpan, or urinal?: None Help from another person bathing (including washing, rinsing, drying)?: None Help from another person to put on and taking off regular upper body clothing?: None Help from another person to put on and taking off regular lower body clothing?: None 6 Click Score: 24   End of Session Equipment Utilized During Treatment: Gait belt  Activity Tolerance: Patient tolerated treatment well Patient left: in bed;with call bell/phone within reach;with family/visitor present  OT Visit Diagnosis: Other abnormalities of gait and mobility (R26.89)                Time: 1456-1510 OT Time Calculation (min): 14 min Charges:  OT General Charges $OT Visit: 1 Visit OT Evaluation $OT Eval Low Complexity: 1 Low  Arman Filter., MPH, MS, OTR/L ascom [phone] 02/10/23, 3:19 PM

## 2023-02-10 NOTE — Progress Notes (Signed)
Code stroke cart activated at 1013. Teleneurology paged at 1015. Per bedside RN, pt  in CT. On stroke cart arrival to CT at 1016- Dr. Selina Cooley at bedside assessing pt  .    Ricci Barker, Tele Stroke RN

## 2023-02-11 ENCOUNTER — Observation Stay (HOSPITAL_BASED_OUTPATIENT_CLINIC_OR_DEPARTMENT_OTHER)
Admit: 2023-02-11 | Discharge: 2023-02-11 | Disposition: A | Discharge status: Home / Self Care | Payer: 59 | Attending: Internal Medicine | Admitting: Internal Medicine

## 2023-02-11 DIAGNOSIS — R531 Weakness: Secondary | ICD-10-CM | POA: Diagnosis not present

## 2023-02-11 DIAGNOSIS — I6389 Other cerebral infarction: Secondary | ICD-10-CM

## 2023-02-11 DIAGNOSIS — I639 Cerebral infarction, unspecified: Secondary | ICD-10-CM | POA: Diagnosis not present

## 2023-02-11 DIAGNOSIS — R4781 Slurred speech: Secondary | ICD-10-CM | POA: Diagnosis not present

## 2023-02-11 DIAGNOSIS — E119 Type 2 diabetes mellitus without complications: Secondary | ICD-10-CM

## 2023-02-11 DIAGNOSIS — R29810 Facial weakness: Secondary | ICD-10-CM | POA: Diagnosis not present

## 2023-02-11 LAB — HEMOGLOBIN A1C
Hgb A1c MFr Bld: 6.8 % — ABNORMAL HIGH (ref 4.8–5.6)
Mean Plasma Glucose: 148 mg/dL

## 2023-02-11 LAB — ECHOCARDIOGRAM COMPLETE
AR max vel: 2.12 cm2
AV Area VTI: 2.68 cm2
AV Area mean vel: 2.08 cm2
AV Mean grad: 3 mmHg
AV Peak grad: 4.8 mmHg
Ao pk vel: 1.1 m/s
Area-P 1/2: 3.13 cm2
Height: 69 in
MV VTI: 1.37 cm2
S' Lateral: 2.7 cm
Weight: 3104.08 [oz_av]

## 2023-02-11 LAB — GLUCOSE, CAPILLARY
Glucose-Capillary: 139 mg/dL — ABNORMAL HIGH (ref 70–99)
Glucose-Capillary: 188 mg/dL — ABNORMAL HIGH (ref 70–99)

## 2023-02-11 NOTE — TOC Transition Note (Signed)
Transition of Care [facility]) - CM/SW Discharge Note   Patient Details  Name: Peter Galloway MRN: [id number] Date of Birth: 03-10-1971  [facility] CM/SW Contact:  Garret Reddish, RN Phone Number: 02/11/2023, 4:06 PM   Clinical Narrative:       Final next level of care: OP Rehab (Co-pay for home health PT, OT, Speech servies was 125 per sessions.  Patient could not afford and was agreeable to Outpatient services.  He has elected to go to [facility].  He informs me that he will transportation .) Barriers to Discharge: No Barriers Identified   Patient Goals and CMS Choice CMS Medicare.gov Compare Post Acute Care list provided to:: Patient Choice offered to / list presented to : Patient  Discharge Placement    Chart reviewed.  Noted that patient has orders for discharge today.    Noted that PT has recommended home health PT services.  I have spoken with [facility] and they informed me that patient will have  125 dollar co-pay for each therapy session.  I have informed patient of the above information and he reports that he is not able to afford.  I have spoken to him about Outpatient Therapy services.  Mrs. Niblett is agreeable to outpatient therapy services.  Mr. Freely does not currently have a PCP.  Mr. Lafon was agreeable to me arranging a PCP appointment.    PCP appointment made with [facility] for September 24 th at 0930 am.  Patient will see Grayling Congress.  I have informed patient of this information and placed on the patient Discharge Instructions.    Patient reports that he does have a cane at home.  No other DME needed.   I have informed Dr. Myriam Forehand of the above information.  I have faxed Outpatient Therapy orders to [facility].    I have informed staff nurse of the above information.                    Patient and family notified of of transfer: 02/11/23  Discharge Plan and Services Additional resources  added to the After Visit Summary for                  DME Arranged:  (Patient reports that he has a cane at home.  No tother DME recommended.)                    Social Determinants of Health (SDOH) Interventions SDOH Screenings   Food Insecurity: No Food Insecurity (02/10/2023)  Housing: Low Risk  (02/10/2023)  Transportation Needs: No Transportation Needs (02/10/2023)  Utilities: Not At Risk (02/10/2023)  Tobacco Use: Medium Risk (02/10/2023)     Readmission Risk Interventions     No data to display

## 2023-02-11 NOTE — Progress Notes (Signed)
Pt declines wheelchair transport at discharge

## 2023-02-11 NOTE — Progress Notes (Signed)
Neurology progress note:  S: Dysarthria continues to improve. No L sided weakness. No new complaints today.  Stroke workup this admission:  MRI brain wo: acute ischemic infarct L paramedian pons, personal review  CTA H&N 1. Occlusion of the left A1 segment and likely the left A2 segment. This may be acute. 2. Occlusion of the right MCA. This is of uncertain chronicity given reconstitution at the right M1/M2 junction and prior infarct in the right MCA territory. Severe focal stenosis in an M2 segment of the right MCA. Decreased arborization in the right MCA territory. 3. Moderate narrowing of the supraclinoid ICA on the left. 4. No hemodynamically significant stenosis in the neck. 5. No acute infarct on perfusion imaging.   I personally reviewed all CNS imaging and agree with the above interpretations with the caveat that given patient has no sx that localize to the L ACA these occlusions are felt to be chronic.  TTE - no intracardiac clot, neg bubble  Stroke Labs     Component Value Date/Time   CHOL 121 02/10/2023 1036   TRIG 243 (H) 02/10/2023 1036   HDL 36 (L) 02/10/2023 1036   CHOLHDL 3.4 02/10/2023 1036   VLDL 49 (H) 02/10/2023 1036   LDLCALC 36 02/10/2023 1036    Lab Results  Component Value Date/Time   HGBA1C 6.8 (H) 02/10/2023 10:36 AM    Physical Exam Gen: A&O x4, NAD HEENT: Atraumatic, normocephalic;mucous membranes moist; oropharynx clear, tongue without atrophy or fasciculations. Neck: Supple, trachea midline. Resp: CTAB, no w/r/r CV: RRR, no m/g/r; nml S1 and S2. 2+ symmetric peripheral pulses. Abd: soft/NT/ND; nabs x 4 quad Extrem: Nml bulk; no cyanosis, clubbing, or edema.   Neuro: *MS: A&O x4. Follows multi-step commands.  *Speech: fluid, mildly dysarthric, able to name and repeat *CN:    I: Deferred   II,III: PERRLA, VFF by confrontation, optic discs unable to be visualized 2/2 pupillary constriction   III,IV,VI: EOMI w/o nystagmus, no ptosis    V: Sensation intact from V1 to V3 to LT   VII: Eyelid closure was full.  L NLF flattening   VIII: Hearing intact to voice   IX,X: Voice normal, palate elevates symmetrically    XI: SCM/trap 5/5 bilat   XII: Tongue protrudes midline, no atrophy or fasciculations  *Motor:   Normal bulk.  No tremor, rigidity or bradykinesia. No drift in any extremity although his grip strength is slightly weaker on the L than the right *Sensory: Intact to light touch, pinprick, temperature vibration throughout. Symmetric. Propioception intact bilat.  No double-simultaneous extinction.  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 His sx are mild and improving. Etiology is small vessel disease. Stroke workup is now completed. - Continue home aspirin 81mg  + plavix. He was on this regimen PTA. For neuro indications he should be on DAPT for at least 90 days 2/2 severe intracranial stenosis. If he has cardiac or other indication to continue DAPT longer, neuro is OK with that. - Continue home crestor; LDL is at goal - PT/OT/SLP - Stroke  education - I will arrange outpatient neuro f/u - OK to discharge from neuro standpoint  Bing Neighbors, MD [facility] [phone]  If 7pm- 7am, please page neurology on call as listed in AMION.

## 2023-02-11 NOTE — Discharge Instructions (Signed)
Primary Care Appointment [facility] [address] [place], [place] [address] [phone]  Appt- September 24th 2024 at 930 am Provider- Grayling Congress  Please bring photo ID, Insurance Card and medications or medication list that you are currently taking.  Will need to make sure you attend appointment so that the PCP will be able to sign Outpatient PT, OT, Speech orders.

## 2023-02-11 NOTE — Discharge Summary (Signed)
Physician Discharge Summary   Patient: Peter Galloway MRN: [id number] DOB: [birth year]  Admit date:     02/10/2023  Discharge date: 02/11/23  Discharge Physician: Lurene Shadow   PCP: Pcp, No   Recommendations at discharge:   Outpatient follow-up with neurologist recommended (office will call to schedule appointment) Outpatient follow-up with speech therapist recommended.  Discharge Diagnoses: Principal Problem:   Slurred speech Active Problems:   CVA (cerebral vascular accident) (HCC)   Type 2 diabetes mellitus (HCC)  Resolved Problems:   * No resolved hospital problems. *  Hospital Course:  Peter Galloway is a 52 y.o. male with medical history significant for hypertension, type 2 diabetes mellitus, CKD stage II, history of stroke with left-sided weakness, former smoker (quit more than 10 years ago), CAD with previous NSTEMI s/p RCA stent in January 2019, hyperlipidemia, who presented to the hospital with slurred speech, facial droop and left-sided numbness affecting the entire left upper extremity and toes on the left foot.Marland Kitchen  He said he has a history of stroke 13 years ago and he had residual left-sided numbness from the stroke.  He said he had a glass of wine with sweet tea the night prior to admission.  Last known normal was around 11:30 PM the night prior to admission.  He fell asleep and did not even finish his drink.  He woke up on the morning of admission and noticed that he had left-sided numbness and slurred speech.  His gait was unsteady and he had to hold onto furniture to get around the house.  No headache, confusion, change in vision, vomiting, diarrhea, abdominal pain, shortness of breath or chest pain.   MRI brain reviewed no acute infarct of the left paramedian pons and old right frontal operculum infarct.  He was evaluated by a neurologist.  Patient said he was previously taking both aspirin and Plavix but had stopped taking aspirin for about 5 or 6 months prior to  admission.  Neurologist recommended dual antiplatelet therapy with low-dose aspirin and Plavix for at least 90 days because of intracranial stenosis noted on CTA head and neck.  There was no atrial fibrillation on EKG or telemetry.  2D echo showed normal EF and there was no evidence of cardiac thrombus.  He was evaluated by PT and OT who recommended home health therapy.  However, patient said he cannot afford home health therapy and prefers outpatient PT and OT.  Speech therapist also recommended outpatient speech therapy.  Left-sided numbness and weakness have improved.  He still has slurred speech.  His condition has improved and he is deemed stable for discharge to home today.        Consultants: Neurologist Procedures performed: None Disposition: Home health Diet recommendation:  Discharge Diet Orders (From admission, onward)     Start     Ordered   02/11/23 0000  Diet - low sodium heart healthy        02/11/23 1508   02/11/23 0000  Diet Carb Modified        02/11/23 1508           Cardiac and Carb modified diet DISCHARGE MEDICATION: Allergies as of 02/11/2023       Reactions   Lisinopril Cough   Other reaction(s): OTHER        Medication List     TAKE these medications    aspirin EC 81 MG tablet Take 81 mg by mouth daily.   Basaglar KwikPen 100 UNIT/ML Inject 40-50 Units into the skin  at bedtime.   clopidogrel 75 MG tablet Commonly known as: PLAVIX Take 1 tablet (75 mg total) by mouth daily with breakfast.   gabapentin 300 MG capsule Commonly known as: NEURONTIN Take 300 mg by mouth 3 (three) times daily as needed.   glipiZIDE 10 MG tablet Commonly known as: GLUCOTROL Take 10 mg by mouth daily before breakfast.   Insulin Aspart FlexPen 100 UNIT/ML Commonly known as: NOVOLOG Inject 10-50 Units into the skin 3 (three) times daily before meals.   Jardiance 25 MG Tabs tablet Generic drug: empagliflozin Take 25 mg by mouth daily.   losartan 100 MG  tablet Commonly known as: COZAAR Take 100 mg by mouth daily.   metFORMIN 1000 MG tablet Commonly known as: GLUCOPHAGE Take 1 tablet by mouth 2 (two) times daily with a meal.   metoprolol tartrate 50 MG tablet Commonly known as: LOPRESSOR Take 1 tablet by mouth 2 (two) times daily.   rosuvastatin 20 MG tablet Commonly known as: CRESTOR Take 20 mg by mouth at bedtime.        Discharge Exam: Filed Weights   02/10/23 1018  Weight: 88 kg   GEN: NAD SKIN: Warm and dry EYES: No pallor or icterus ENT: MMM CV: RRR PULM: CTA B ABD: soft, ND, NT, +BS CNS: AAO x 3, slurred speech, left facial droop EXT: No edema or tenderness   Condition at discharge: good  The results of significant diagnostics from this hospitalization (including imaging, microbiology, ancillary and laboratory) are listed below for reference.   Imaging Studies: ECHOCARDIOGRAM COMPLETE  Result Date: 02/11/2023    ECHOCARDIOGRAM REPORT   Patient Name:   Peter Galloway Date of Exam: 02/11/2023 Medical Rec #:  [id number]     Height:       69.0 in Accession #:    [id number]    Weight:       194.0 lb Date of Birth:  14-Nov-1970     BSA:          2.039 m Patient Age:    51 years      BP:           162/94 mmHg Patient Gender: M             HR:           66 bpm. Exam Location:  [facility] Procedure: 2D Echo, Color Doppler, Cardiac Doppler and Saline Contrast Bubble            Study Indications:     Stroke I63.9  History:         Patient has no prior history of Echocardiogram examinations.                  Stroke; Risk Factors:Diabetes and Hypertension.  Sonographer:     Cristela Blue Referring Phys:  [id number] Lurene Shadow Diagnosing Phys: Julien Nordmann MD IMPRESSIONS  1. Left ventricular ejection fraction, by estimation, is 55 to 60%. The left ventricle has normal function. The left ventricle has no regional wall motion abnormalities. There is mild left ventricular hypertrophy. Left ventricular diastolic parameters are consistent with  Grade I diastolic dysfunction (impaired relaxation).  2. Right ventricular systolic function is normal. The right ventricular size is normal. There is normal pulmonary artery systolic pressure. The estimated right ventricular systolic pressure is 17.1 mmHg.  3. The mitral valve is normal in structure. Mild mitral valve regurgitation. No evidence of mitral stenosis.  4. The aortic valve is tricuspid. Aortic valve regurgitation is not visualized.  No aortic stenosis is present.  5. The inferior vena cava is normal in size with greater than 50% respiratory variability, suggesting right atrial pressure of 3 mmHg.  6. Agitated saline contrast bubble study was negative, with no evidence of any interatrial shunt. FINDINGS  Left Ventricle: Left ventricular ejection fraction, by estimation, is 55 to 60%. The left ventricle has normal function. The left ventricle has no regional wall motion abnormalities. The left ventricular internal cavity size was normal in size. There is  mild left ventricular hypertrophy. Left ventricular diastolic parameters are consistent with Grade I diastolic dysfunction (impaired relaxation). Right Ventricle: The right ventricular size is normal. No increase in right ventricular wall thickness. Right ventricular systolic function is normal. There is normal pulmonary artery systolic pressure. The tricuspid regurgitant velocity is 1.88 m/s, and  with an assumed right atrial pressure of 3 mmHg, the estimated right ventricular systolic pressure is 17.1 mmHg. Left Atrium: Left atrial size was normal in size. Right Atrium: Right atrial size was normal in size. Pericardium: There is no evidence of pericardial effusion. Mitral Valve: The mitral valve is normal in structure. Mild mitral valve regurgitation. No evidence of mitral valve stenosis. MV peak gradient, 4.8 mmHg. The mean mitral valve gradient is 2.0 mmHg. Tricuspid Valve: The tricuspid valve is normal in structure. Tricuspid valve regurgitation is  mild . No evidence of tricuspid stenosis. Aortic Valve: The aortic valve is tricuspid. Aortic valve regurgitation is not visualized. No aortic stenosis is present. Aortic valve mean gradient measures 3.0 mmHg. Aortic valve peak gradient measures 4.8 mmHg. Aortic valve area, by VTI measures 2.68 cm. Pulmonic Valve: The pulmonic valve was normal in structure. Pulmonic valve regurgitation is not visualized. No evidence of pulmonic stenosis. Aorta: The aortic root is normal in size and structure. Venous: The inferior vena cava is normal in size with greater than 50% respiratory variability, suggesting right atrial pressure of 3 mmHg. IAS/Shunts: No atrial level shunt detected by color flow Doppler. Agitated saline contrast was given intravenously to evaluate for intracardiac shunting. Agitated saline contrast bubble study was negative, with no evidence of any interatrial shunt. There  is no evidence of a patent foramen ovale. There is no evidence of an atrial septal defect.  LEFT VENTRICLE PLAX 2D LVIDd:         4.30 cm   Diastology LVIDs:         2.70 cm   LV e' medial:    4.35 cm/s LV PW:         1.30 cm   LV E/e' medial:  18.6 LV IVS:        1.90 cm   LV e' lateral:   7.40 cm/s LVOT diam:     2.00 cm   LV E/e' lateral: 10.9 LV SV:         46 LV SV Index:   22 LVOT Area:     3.14 cm  RIGHT VENTRICLE RV Basal diam:  2.50 cm RV Mid diam:    2.30 cm LEFT ATRIUM         Index       RIGHT ATRIUM           Index LA diam:    4.00 cm 1.96 cm/m  RA Area:     11.20 cm                                 RA Volume:  19.30 ml  9.46 ml/m  AORTIC VALVE AV Area (Vmax):    2.12 cm AV Area (Vmean):   2.08 cm AV Area (VTI):     2.68 cm AV Vmax:           110.00 cm/s AV Vmean:          74.300 cm/s AV VTI:            0.170 m AV Peak Grad:      4.8 mmHg AV Mean Grad:      3.0 mmHg LVOT Vmax:         74.20 cm/s LVOT Vmean:        49.100 cm/s LVOT VTI:          0.145 m LVOT/AV VTI ratio: 0.85  AORTA Ao Root diam: 3.07 cm MITRAL VALVE                 TRICUSPID VALVE MV Area (PHT): 3.13 cm     TR Peak grad:   14.1 mmHg MV Area VTI:   1.37 cm     TR Vmax:        188.00 cm/s MV Peak grad:  4.8 mmHg MV Mean grad:  2.0 mmHg     SHUNTS MV Vmax:       1.09 m/s     Systemic VTI:  0.14 m MV Vmean:      71.8 cm/s    Systemic Diam: 2.00 cm MV Decel Time: 242 msec MV E velocity: 80.70 cm/s MV A velocity: 116.00 cm/s MV E/A ratio:  0.70 Julien Nordmann MD Electronically signed by Julien Nordmann MD Signature Date/Time: 02/11/2023/12:13:01 PM    Final    MR BRAIN WO CONTRAST  Result Date: 02/11/2023 CLINICAL DATA:  Dysarthria and left facial droop EXAM: MRI HEAD WITHOUT CONTRAST TECHNIQUE: Multiplanar, multiecho pulse sequences of the brain and surrounding structures were obtained without intravenous contrast. COMPARISON:  05/17/2015 brain MRI FINDINGS: Brain: There is an acute infarct of the left paramedian pons. No chronic microhemorrhage or siderosis. There is multifocal hyperintense T2-weighted signal within the white matter. Parenchymal volume and CSF spaces are normal. Old right frontal operculum infarct. The midline structures are normal. Vascular: Normal flow voids. Skull and upper cervical spine: Normal marrow signal. Sinuses/Orbits: Paranasal sinuses are clear. No mastoid effusion. Normal orbits. Other: None IMPRESSION: 1. Acute infarct of the left paramedian pons. No hemorrhage or mass effect. 2. Old right frontal operculum infarct. Electronically Signed   By: Deatra Robinson M.D.   On: 02/11/2023 01:46   CT ANGIO HEAD NECK W WO CM W PERF (CODE STROKE)  Result Date: 02/10/2023 CLINICAL DATA:  Neuro deficit, acute, stroke suspected EXAM: CT ANGIOGRAPHY HEAD AND NECK CT PERFUSION BRAIN TECHNIQUE: Multidetector CT imaging of the head and neck was performed using the standard protocol during bolus administration of intravenous contrast. Multiplanar CT image reconstructions and MIPs were obtained to evaluate the vascular anatomy. Carotid stenosis  measurements (when applicable) are obtained utilizing NASCET criteria, using the distal internal carotid diameter as the denominator. Multiphase CT imaging of the brain was performed following IV bolus contrast injection. Subsequent parametric perfusion maps were calculated using RAPID software. RADIATION DOSE REDUCTION: This exam was performed according to the departmental dose-optimization program which includes automated exposure control, adjustment of the mA and/or kV according to patient size and/or use of iterative reconstruction technique. CONTRAST:  OMNIPAQUE IOHEXOL 350 MG/ML SOLN COMPARISON:  Brain MR 05/17/15 FINDINGS: See same day CT head for intracranial  findings. CTA NECK FINDINGS Aortic arch: Four-vessel aortic arch. Imaged portion shows no evidence of aneurysm or dissection. No significant stenosis of the major arch vessel origins. Right carotid system: No evidence of dissection, stenosis (50% or greater) or occlusion. Left carotid system: No evidence of dissection, stenosis (50% or greater) or occlusion. Vertebral arteries: Codominant. No evidence of dissection, stenosis (50% or greater) or occlusion. Skeleton: Negative Other neck: Negative Upper chest: Negative Review of the MIP images confirms the above findings CTA HEAD FINDINGS Anterior circulation: The right MCA is occluded. This is of uncertain chronicity given reconstitution of the right M1 M2 junction. There is severe focal stenosis in an M2 segment of the right MCA (series 7, image 103). There is decreased arborization in the right MCA territory. The left A1 segment is occluded (series 8, image 83). The left A2 segment is likely also occluded (series 8, image 72). Moderate narrowing of the supraclinoid ICA on the left. Posterior circulation: No significant stenosis, proximal occlusion, aneurysm, or vascular malformation. Venous sinuses: As permitted by contrast timing, patent. Anatomic variants: None Review of the MIP images confirms  the above findings CT Brain Perfusion Findings: ASPECTS: 10 when accounting for chronic findings. CBF (<30%) Volume: 0mL Perfusion (Tmax>6.0s) volume: 3mL Mismatch Volume: 3mL Infarction Location:No acute infarct. IMPRESSION: 1. Occlusion of the left A1 segment and likely the left A2 segment. This may be acute. 2. Occlusion of the right MCA. This is of uncertain chronicity given reconstitution at the right M1/M2 junction and prior infarct in the right MCA territory. Severe focal stenosis in an M2 segment of the right MCA. Decreased arborization in the right MCA territory. 3. Moderate narrowing of the supraclinoid ICA on the left. 4. No hemodynamically significant stenosis in the neck. 5. No acute infarct on perfusion imaging. Findings were paged to Dr. Selina Cooley on 02/10/23 at 10:47 AM via [facility] paging system. Electronically Signed   By: Lorenza Cambridge M.D.   On: 02/10/2023 10:51   CT HEAD CODE STROKE WO CONTRAST  Result Date: 02/10/2023 CLINICAL DATA:  Code stroke. Neuro deficit, acute, stroke suspected left-sided weakness EXAM: CT HEAD WITHOUT CONTRAST TECHNIQUE: Contiguous axial images were obtained from the base of the skull through the vertex without intravenous contrast. RADIATION DOSE REDUCTION: This exam was performed according to the departmental dose-optimization program which includes automated exposure control, adjustment of the mA and/or kV according to patient size and/or use of iterative reconstruction technique. COMPARISON:  Brain MR 05/17/15 FINDINGS: Brain: Chronic right MCA territory infarct, unchanged from 05/17/2015. no hemorrhage. No hydrocephalus. No extra-axial fluid collection. Background of moderate chronic microvascular ischemic change. No CT evidence of an acute cortical infarct. Vascular: No hyperdense vessel or unexpected calcification. Skull: Normal. Negative for fracture or focal lesion. Sinuses/Orbits: No mastoid or middle ear effusion. Mucosal thickening of the floor of bilateral  maxillary sinuses. Orbits are unremarkable. Other: None ASPECTS ([place] Stroke Program Early CT Score): 10 when accounting for chronic findings. IMPRESSION: No hemorrhage or CT evidence of an acute cortical infarct. Aspects is 10 when accounting for chronic findings. Findings were paged to Dr. Selina Cooley on 02/10/2023 at 10:30 a.m. via [facility] paging system. Electronically Signed   By: Lorenza Cambridge M.D.   On: 02/10/2023 10:31    Microbiology: No results found for this or any previous visit.  Labs: CBC: Recent Labs  Lab 02/10/23 1036  WBC 7.2  NEUTROABS 5.1  HGB 14.8  HCT 43.8  MCV 95.4  PLT 144*   Basic Metabolic Panel: Recent  Labs  Lab 02/10/23 1036  NA 136  K 4.6  CL 105  CO2 22  GLUCOSE 116*  BUN 26*  CREATININE 1.12  CALCIUM 8.8*   Liver Function Tests: Recent Labs  Lab 02/10/23 1036  AST 26  ALT 29  ALKPHOS 56  BILITOT 0.6  PROT 6.4*  ALBUMIN 3.6   CBG: Recent Labs  Lab 02/10/23 1012 02/10/23 1804 02/10/23 2200 02/11/23 0805 02/11/23 1154  GLUCAP 132* 258* 91 139* 188*    Discharge time spent: greater than 30 minutes.  Signed: Lurene Shadow, MD [facility] 02/11/2023

## 2023-02-11 NOTE — Progress Notes (Signed)
Speech Language Pathology Treatment: Cognitive-Linquistic (dysarthria)  Patient Details Name: Peter Galloway MRN: [id number] DOB: 10-Dec-1970 Today's Date: 02/11/2023 Time: 7829-5621 SLP Time Calculation (min) (ACUTE ONLY): 40 min  Assessment / Plan / Recommendation Clinical Impression  Met w/ pt for continued therapy addressing Dysarthria, Dysarthria tx and exercises, and Education on strategies. Pt's family member (earlier) stated that she "could understand him" during her visit w/ him. Pt himself stated his speech was "better" and that he "knew" he needed to "slow down b/c I talk very fast -- my mind is going a mile a minute".    Continued deficits in patient's motor speech and articulation of speech sounds noted: deficits c/b rushed speech, dysfluency intermittently, and imprecise articulation of speech. Min breathiness noted; lack of full breath support w/ lengthier sentences and speech noted. Possible min+ difficulty w/ Verbal Apraxia tasks w/ consonant placements and multisyllabic words- decreased coordination in rapid motor movements/speech; and also /S/ sounds(anterior placement). 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    SLP provided education re: importance of skilled ST services Outpatient to address speech-related strategies to combat the "slurred" speech/Dysarthria. Pt educated on and modeled/practiced speech/articulatory strategies to include: increasing loudness(w/ appropriate breath support), Slowing rate of speech, and using bigger, over-articulated speech - Enunciation. These were practiced w/ Menu reading, his Cats' names, and automatic speech tasks. Patient exhibited improved speech intelligibility and precision of speech sounds when utilizing the strategies; pt noted the difference as well.  Education and handouts given on the strategies; practiced w/ pt who was able to follow through w/ 100% accuracy when he "remembered to slow down - I talk  fast, you know".  Recommend f/u skilled ST services post D/C to address patient's Dysarthria and speech-related goals using articulatory strategies to increase overall intelligibility, communication effectiveness in ADLs, and quality of life. MD and Team updated. Questions answered; handouts given.      HPI HPI: Pt is a 52 y.o. 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  Pt endorses his s/s of "better".   Head CT: Chronic right MCA territory infarct, unchanged from  05/17/2015. no hemorrhage. No hydrocephalus. No extra-axial fluid  collection. Background of moderate chronic microvascular ischemic  change. No CT evidence of an acute cortical infarct.      SLP Plan  Continue with current plan of care      Recommendations for follow up therapy are one component of a multi-disciplinary discharge planning process, led by the attending physician.  Recommendations may be updated based on patient status, additional functional criteria and insurance authorization.    Recommendations                    (Outpt ST services) Oral care BID;Patient independent with oral care   PRN Dysarthria and anarthria (R47.1)     Continue with current plan of care       Jerilynn Som, MS, CCC-SLP Speech Language Pathologist Rehab Services; [facility] [phone] (ascom) Ayron Fillinger  02/11/2023, 5:07 PM

## 2023-02-11 NOTE — Evaluation (Signed)
Physical Therapy Evaluation Patient Details Name: Peter Galloway MRN: [id number] DOB: 10-11-1970 Today's Date: 02/11/2023  History of Present Illness  Pt is a 52 y.o. male presenting to hospital 02/10/23 with facial droop, slurred speech, and L sided weakness/numbness; unsteady gait.  Imaging showing acute infarct of L paramedian pons.  PMH includes htn, DM, CKD, h/o stroke with L sided weakness/numbness, h/o smoking, CAD with previous NSTEMI s/p RCA stent, HLD.  Clinical Impression  Prior to medical concerns, pt was independent with functional mobility; lives alone in 1 level home with steps to enter; works 2 jobs.  No c/o pain during session.  Decreased L LE coordination and impaired L LE strength noted (pt reports h/o stroke and is close to baseline except for speech).  Currently pt is modified independent with bed mobility; independent with transfers; CGA ambulating 200 feet (pt appearing "shaky"/unsteady ambulating without UE support but improved balance noted ambulating with SPC); and CGA navigating 4 steps with use of railings.  Pt would currently benefit from skilled PT to address noted impairments and functional limitations (see below for any additional details).  Upon hospital discharge, pt would benefit from ongoing therapy.     If plan is discharge home, recommend the following: A little help with walking and/or transfers;A little help with bathing/dressing/bathroom;Assistance with cooking/housework;Assist for transportation;Help with stairs or ramp for entrance   Can travel by private vehicle    Yes    Equipment Recommendations Gilmer Mor (pt reports having SPC already)  Recommendations for Other Services       Functional Status Assessment Patient has had a recent decline in their functional status and demonstrates the ability to make significant improvements in function in a reasonable and predictable amount of time.     Precautions / Restrictions Precautions Precautions:  Fall Restrictions Weight Bearing Restrictions: No      Mobility  Bed Mobility Overal bed mobility: Modified Independent             General bed mobility comments: Semi-supine to/from sitting    Transfers Overall transfer level: Independent Equipment used: None Transfers: Sit to/from Stand Sit to Stand: Independent           General transfer comment: steady safe transfer from bed    Ambulation/Gait Ambulation/Gait assistance: Contact guard assist Gait Distance (Feet): 200 Feet Assistive device: None, Straight cane   Gait velocity: decreased     General Gait Details: pt mildly unsteady ambulating x90 feet (no AD use); pt steady ambulating 110 feet with SPC use  Stairs Stairs: Yes Stairs assistance: Contact guard assist Stair Management: Two rails, Step to pattern, Alternating pattern, Forwards Number of Stairs: 4 General stair comments: alternating pattern ascending steps; step to pattern descending steps  Wheelchair Mobility     Tilt Bed    Modified Rankin (Stroke Patients Only)       Balance Overall balance assessment: Needs assistance Sitting-balance support: No upper extremity supported, Feet supported Sitting balance-Leahy Scale: Normal Sitting balance - Comments: steady reaching outside BOS   Standing balance support: No upper extremity supported Standing balance-Leahy Scale: Good Standing balance comment: pt able to reach slightly outside of BOS but appearing cautious                             Pertinent Vitals/Pain Pain Assessment Pain Assessment: No/denies pain Vitals (HR and SpO2 on room air) stable and WFL throughout treatment session.    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)     Extremity/Trunk Assessment   Upper Extremity Assessment Upper Extremity Assessment: Defer to OT evaluation (decreased sensation L UE)    Lower Extremity Assessment Lower Extremity Assessment: LLE deficits/detail (R LE WFL (except decreased sensation B feet--pt reports d/t diabetic neuropathy); intact B LE SLR, proprioception, and tone) LLE Deficits / Details: decreased L LE heel to shin coordination; 4+/5 hip flexion; 4-/5 knee extension; 4+/5 knee flexion; 4+/5 DF LLE Sensation: decreased light touch (pt reports h/o diabetic neuropathy) LLE Coordination: decreased gross motor    Cervical / Trunk Assessment Cervical / Trunk Assessment: Normal  Communication   Communication Communication:  (dysarthria) Cueing Techniques: Verbal cues  Cognition Arousal: Alert Behavior During Therapy: WFL for tasks assessed/performed Overall Cognitive Status: Within Functional Limits for tasks assessed                                          General Comments  Nursing cleared pt for participation in physical therapy.  Pt agreeable to PT session.  L facial droop noted.    Exercises     Assessment/Plan    PT Assessment Patient needs continued PT services  PT Problem List Decreased strength;Decreased balance;Decreased mobility;Decreased coordination;Impaired sensation       PT Treatment Interventions DME instruction;Gait training;Stair training;Functional mobility training;Therapeutic activities;Therapeutic exercise;Balance training;Patient/family education    PT Goals (Current goals can be found in the Care Plan  section)  Acute Rehab PT Goals Patient Stated Goal: to improve balance and mobility PT Goal Formulation: With patient Time For Goal Achievement: 02/25/23 Potential to Achieve Goals: Good    Frequency 7X/week     Co-evaluation               AM-PAC PT "6 Clicks" Mobility  Outcome Measure Help needed turning from your back to your side while in a flat bed without using bedrails?: None Help needed moving from lying on your back to sitting on the side of a flat bed without using bedrails?: None Help needed moving to and from a bed to a chair (including a wheelchair)?: A Little Help needed standing up from a chair using your arms (e.g., wheelchair or bedside chair)?: A Little Help needed to walk in hospital room?: A Little Help needed climbing 3-5 steps with a railing? : A Little 6 Click Score: 20    End of Session Equipment Utilized During Treatment: Gait belt Activity Tolerance: Patient tolerated treatment well Patient left: in bed;with call bell/phone within reach Nurse Communication: Mobility status;Precautions PT Visit Diagnosis: Unsteadiness on feet (R26.81);Other abnormalities of gait and mobility (R26.89);Hemiplegia and hemiparesis Hemiplegia - Right/Left: Left Hemiplegia - caused by: Cerebral infarction    Time: 0981-1914 PT Time Calculation (min) (ACUTE ONLY): 23 min   Charges:   PT Evaluation $PT Eval Low Complexity: 1 Low   PT General Charges $$  ACUTE PT VISIT: 1 Visit        Hendricks Limes, PT 02/11/23, 10:11 AM

## 2023-02-11 NOTE — Progress Notes (Signed)
     [facility] REFERRAL        Occupational Therapy * Physical Therapy * Speech Therapy                           DATE: 02/11/2023  PATIENT NAME: Peter Galloway  PATIENT MRN: [id number]       DIAGNOSIS/DIAGNOSIS CODE: 163.9  DATE OF DISCHARGE: 02/11/2023       PRIMARY CARE PHYSICIAN   [facility]  PCP PHONE/FAX: [phone]     Dear Provider (Name: [facility] outpatient _X_  Fax: [phone]   I certify that I have examined this patient and that occupational/physical/speech therapy is necessary on an outpatient basis.    The patient has expressed interest in completing their recommended course of therapy at your  location.  Once a formal order from the patient's primary care physician has been obtained, please [phone]) contact him/her to schedule an appointment for evaluation at your earliest convenience.   [ X]  Physical Therapy Evaluate and Treat  [ X ]  Occupational Therapy Evaluate and Treat  [  X ]  Speech Therapy Evaluate and Treat  Patient has new PCP appointment with [facility] on 02-23-2023.         The patient's primary care physician (listed above) must furnish and be responsible for a formal order such that the recommended services may be furnished while under the primary physician's care, and that the plan of care will be established and reviewed every 30 days (or more often if condition necessitates).

## 2023-02-11 NOTE — Progress Notes (Signed)
*  PRELIMINARY RESULTS* Echocardiogram 2D Echocardiogram has been performed.  Cristela Blue 02/11/2023, 8:50 AM

## 2023-02-16 DIAGNOSIS — E1122 Type 2 diabetes mellitus with diabetic chronic kidney disease: Secondary | ICD-10-CM | POA: Diagnosis not present

## 2023-02-16 DIAGNOSIS — N189 Chronic kidney disease, unspecified: Secondary | ICD-10-CM | POA: Diagnosis not present

## 2023-02-16 DIAGNOSIS — Z8673 Personal history of transient ischemic attack (TIA), and cerebral infarction without residual deficits: Secondary | ICD-10-CM | POA: Diagnosis not present

## 2023-02-16 DIAGNOSIS — I639 Cerebral infarction, unspecified: Secondary | ICD-10-CM | POA: Diagnosis not present

## 2023-02-16 DIAGNOSIS — I25119 Atherosclerotic heart disease of native coronary artery with unspecified angina pectoris: Secondary | ICD-10-CM | POA: Diagnosis not present

## 2023-02-16 DIAGNOSIS — E7800 Pure hypercholesterolemia, unspecified: Secondary | ICD-10-CM | POA: Diagnosis not present

## 2023-02-16 DIAGNOSIS — I2510 Atherosclerotic heart disease of native coronary artery without angina pectoris: Secondary | ICD-10-CM | POA: Diagnosis not present

## 2023-02-16 DIAGNOSIS — E6609 Other obesity due to excess calories: Secondary | ICD-10-CM | POA: Diagnosis not present

## 2023-02-16 DIAGNOSIS — I10 Essential (primary) hypertension: Secondary | ICD-10-CM | POA: Diagnosis not present

## 2023-02-16 DIAGNOSIS — Z955 Presence of coronary angioplasty implant and graft: Secondary | ICD-10-CM | POA: Diagnosis not present

## 2023-02-23 ENCOUNTER — Ambulatory Visit: Payer: 59 | Admitting: Family

## 2023-02-25 DIAGNOSIS — I639 Cerebral infarction, unspecified: Secondary | ICD-10-CM | POA: Diagnosis not present

## 2023-03-01 ENCOUNTER — Ambulatory Visit: Payer: 59 | Admitting: Cardiology

## 2023-03-01 ENCOUNTER — Encounter: Payer: Self-pay | Admitting: Cardiology

## 2023-03-01 VITALS — BP 166/86 | HR 75 | Ht 69.5 in | Wt 200.8 lb

## 2023-03-01 DIAGNOSIS — F32A Depression, unspecified: Secondary | ICD-10-CM

## 2023-03-01 DIAGNOSIS — E1122 Type 2 diabetes mellitus with diabetic chronic kidney disease: Secondary | ICD-10-CM

## 2023-03-01 DIAGNOSIS — F418 Other specified anxiety disorders: Secondary | ICD-10-CM | POA: Insufficient documentation

## 2023-03-01 DIAGNOSIS — E1169 Type 2 diabetes mellitus with other specified complication: Secondary | ICD-10-CM | POA: Diagnosis not present

## 2023-03-01 DIAGNOSIS — E7800 Pure hypercholesterolemia, unspecified: Secondary | ICD-10-CM

## 2023-03-01 DIAGNOSIS — I10 Essential (primary) hypertension: Secondary | ICD-10-CM

## 2023-03-01 MED ORDER — VENLAFAXINE HCL ER 37.5 MG PO CP24
37.5000 mg | ORAL_CAPSULE | Freq: Every day | ORAL | 2 refills | Status: DC
Start: 2023-03-01 — End: 2023-04-05

## 2023-03-01 NOTE — Progress Notes (Signed)
Established Patient Office Visit  Subjective:  Patient ID: Peter Galloway, male    DOB: 1971/02/20  Age: 52 y.o. MRN: [id number]  Chief Complaint  Patient presents with   Establish Care    NPE    Patient in office to establish care. Patient reports feeling fine physically, gaining his strength back from his recent stroke.  Patient follows with nephrology, next appointment 06/2023 per patient.  Last eye appointment 10/2022 per patient.  Patient states blood pressure 130's/80's at home. May need to add an additional antihypertensive if elevated at next visit.  Up to date on colonoscopy.  Patient PHQ9 and GAD7 scores today are both high. Patient states he has no plans to harm himself, he is "too scared". Will start Effexor. Discussed going to ED or walk in clinic at [facility]. Patient verbalized understanding.     No other concerns at this time.   Past Medical History:  Diagnosis Date   CAD (coronary artery disease)    Chronic kidney disease    Diabetes mellitus without complication (HCC)    Hyperlipidemia    Hypertension    Myocardial infarction (HCC)    Stroke [facility])     Past Surgical History:  Procedure Laterality Date   APPENDECTOMY     CORONARY STENT INTERVENTION N/A 06/02/2017   Procedure: CORONARY STENT INTERVENTION;  Surgeon: Alwyn Pea, MD;  Location: [facility] INVASIVE CV LAB;  Service: Cardiovascular;  Laterality: N/A;   HERNIA REPAIR     LEFT HEART CATH AND CORONARY ANGIOGRAPHY N/A 06/02/2017   Procedure: LEFT HEART CATH AND CORONARY ANGIOGRAPHY;  Surgeon: Alwyn Pea, MD;  Location: [facility] INVASIVE CV LAB;  Service: Cardiovascular;  Laterality: N/A;    Social History   Socioeconomic History   Marital status: Single    Spouse name: Not on file   Number of children: Not on file   Years of education: Not on file   Highest education level: Not on file  Occupational History   Not on file  Tobacco Use   Smoking status: Former    Types: Cigarettes    Start date:  2021   Smokeless tobacco: Never  Substance and Sexual Activity   Alcohol use: Yes   Drug use: No   Sexual activity: Not Currently  Other Topics Concern   Not on file  Social History Narrative   Not on file   Social Determinants of Health   Financial Resource Strain: Not on file  Food Insecurity: No Food Insecurity (02/10/2023)   Hunger Vital Sign    Worried About Running Out of Food in the Last Year: Never true    Ran Out of Food in the Last Year: Never true  Transportation Needs: No Transportation Needs (02/10/2023)   PRAPARE - Administrator, Civil Service (Medical): No    Lack of Transportation (Non-Medical): No  Physical Activity: Not on file  Stress: Not on file  Social Connections: Not on file  Intimate Partner Violence: Not At Risk (02/10/2023)   Humiliation, Afraid, Rape, and Kick questionnaire    Fear of Current or Ex-Partner: No    Emotionally Abused: No    Physically Abused: No    Sexually Abused: No    Family History  Problem Relation Age of Onset   Hypertension Mother    Diabetes Father     Allergies  Allergen Reactions   Lisinopril Cough    Other reaction(s): OTHER     Review of Systems  Constitutional: Negative.  HENT: Negative.    Eyes: Negative.   Respiratory: Negative.  Negative for shortness of breath.   Cardiovascular: Negative.  Negative for chest pain.  Gastrointestinal: Negative.  Negative for abdominal pain, constipation and diarrhea.  Genitourinary: Negative.   Musculoskeletal:  Negative for joint pain and myalgias.  Skin: Negative.   Neurological: Negative.  Negative for dizziness and headaches.  Endo/Heme/Allergies: Negative.   All other systems reviewed and are negative.      Objective:   BP (!) 166/86 (BP Location: Left Arm, Patient Position: Sitting, Cuff Size: Large)   Pulse 75   Ht 5' 9.5" (1.765 m)   Wt 200 lb 12.8 oz (91.1 kg)   SpO2 96%   BMI 29.23 kg/m   Vitals:   03/01/23 1256 03/01/23 1320  BP:  (!) 158/96 (!) 166/86  Pulse: 75   Height: 5' 9.5" (1.765 m)   Weight: 200 lb 12.8 oz (91.1 kg)   SpO2: 96%   BMI (Calculated): 29.24     Physical Exam Nursing note reviewed.  Constitutional:      Appearance: Normal appearance. He is normal weight.  HENT:     Head: Normocephalic and atraumatic.     Nose: Nose normal.     Mouth/Throat:     Mouth: Mucous membranes are moist.     Pharynx: Oropharynx is clear.  Eyes:     Extraocular Movements: Extraocular movements intact.     Conjunctiva/sclera: Conjunctivae normal.     Pupils: Pupils are equal, round, and reactive to light.  Cardiovascular:     Rate and Rhythm: Normal rate and regular rhythm.     Pulses: Normal pulses.     Heart sounds: Normal heart sounds.  Pulmonary:     Effort: Pulmonary effort is normal.     Breath sounds: Normal breath sounds.  Abdominal:     General: Abdomen is flat. Bowel sounds are normal.     Palpations: Abdomen is soft.  Musculoskeletal:        General: Normal range of motion.     Cervical back: Normal range of motion.  Skin:    General: Skin is warm and dry.  Neurological:     General: No focal deficit present.     Mental Status: He is alert and oriented to person, place, and time.  Psychiatric:        Mood and Affect: Mood normal.        Behavior: Behavior normal.        Thought Content: Thought content normal.        Judgment: Judgment normal.      No results found for any visits on 03/01/23.  Recent Results (from the past 2160 hour(s))  CBG monitoring, ED     Status: Abnormal   Collection Time: 02/10/23 10:12 AM  Result Value Ref Range   Glucose-Capillary 132 (H) 70 - 99 mg/dL    Comment: Glucose reference range applies only to samples taken after fasting for at least 8 hours.   Comment 1 Notify RN    Comment 2 Document in Chart   Protime-INR     Status: None   Collection Time: 02/10/23 10:36 AM  Result Value Ref Range   Prothrombin Time 14.2 11.4 - 15.2 seconds   INR 1.1 0.8  - 1.2    Comment: (NOTE) INR goal varies based on device and disease states. Performed at [facility], [address] [place], [place] [address]   APTT     Status: None  Collection Time: 02/10/23 10:36 AM  Result Value Ref Range   aPTT 25 24 - 36 seconds    Comment: Performed at [facility], [address] [place], [place] [address]  CBC     Status: Abnormal   Collection Time: 02/10/23 10:36 AM  Result Value Ref Range   WBC 7.2 4.0 - 10.5 K/uL   RBC 4.59 4.22 - 5.81 MIL/uL   Hemoglobin 14.8 13.0 - 17.0 g/dL   HCT 56.2 13.0 - 86.5 %   MCV 95.4 80.0 - 100.0 fL   MCH 32.2 26.0 - 34.0 pg   MCHC 33.8 30.0 - 36.0 g/dL   RDW 78.4 69.6 - 29.5 %   Platelets 144 (L) 150 - 400 K/uL   nRBC 0.0 0.0 - 0.2 %    Comment: Performed at [facility], [address] [place], [place] [address]  Differential     Status: None   Collection Time: 02/10/23 10:36 AM  Result Value Ref Range   Neutrophils Relative % 71 %   Neutro Abs 5.1 1.7 - 7.7 K/uL   Lymphocytes Relative 19 %   Lymphs Abs 1.4 0.7 - 4.0 K/uL   Monocytes Relative 8 %   Monocytes Absolute 0.5 0.1 - 1.0 K/uL   Eosinophils Relative 2 %   Eosinophils Absolute 0.2 0.0 - 0.5 K/uL   Basophils Relative 0 %   Basophils Absolute 0.0 0.0 - 0.1 K/uL   Immature Granulocytes 0 %   Abs Immature Granulocytes 0.02 0.00 - 0.07 K/uL    Comment: Performed at [facility], [address] [place], [place] [address]  Comprehensive metabolic panel     Status: Abnormal   Collection Time: 02/10/23 10:36 AM  Result Value Ref Range   Sodium 136 135 - 145 mmol/L   Potassium 4.6 3.5 - 5.1 mmol/L   Chloride 105 98 - 111 mmol/L   CO2 22 22 - 32 mmol/L   Glucose, Bld 116 (H) 70 - 99 mg/dL    Comment: Glucose reference range applies only to samples taken after fasting for at least 8 hours.   BUN 26 (H) 6 - 20 mg/dL   Creatinine, Ser 0.27 0.61 - 1.24 mg/dL   Calcium 8.8 (L) 8.9 - 10.3 mg/dL   Total Protein 6.4  (L) 6.5 - 8.1 g/dL   Albumin 3.6 3.5 - 5.0 g/dL   AST 26 15 - 41 U/L   ALT 29 0 - 44 U/L   Alkaline Phosphatase 56 38 - 126 U/L   Total Bilirubin 0.6 0.3 - 1.2 mg/dL   GFR, Estimated >25 >36 mL/min    Comment: (NOTE) Calculated using the CKD-EPI Creatinine Equation (2021)    Anion gap 9 5 - 15    Comment: Performed at [facility], [address], [place], [place] [address]  Ethanol     Status: None   Collection Time: 02/10/23 10:36 AM  Result Value Ref Range   Alcohol, Ethyl (B) <10 <10 mg/dL    Comment: (NOTE) Lowest detectable limit for serum alcohol is 10 mg/dL.  For medical purposes only. Performed at [facility], [address] [place], [place] [address]   Lipid panel     Status: Abnormal   Collection Time: 02/10/23 10:36 AM  Result Value Ref Range   Cholesterol 121 0 - 200 mg/dL   Triglycerides 956 (H) <150 mg/dL   HDL 36 (L) >38 mg/dL   Total CHOL/HDL Ratio 3.4 RATIO   VLDL 49 (  H) 0 - 40 mg/dL   LDL Cholesterol 36 0 - 99 mg/dL    Comment:        Total Cholesterol/HDL:CHD Risk Coronary Heart Disease Risk Table                     Men   Women  1/2 Average Risk   3.4   3.3  Average Risk       5.0   4.4  2 X Average Risk   9.6   7.1  3 X Average Risk  23.4   11.0        Use the calculated Patient Ratio above and the CHD Risk Table to determine the patient's CHD Risk.        ATP III CLASSIFICATION (LDL):  <100     mg/dL   Optimal  295-284  mg/dL   Near or Above                    Optimal  130-159  mg/dL   Borderline  132-440  mg/dL   High  >102     mg/dL   Very High Performed at [facility], [address] [place], [place] [address]   Hemoglobin A1c     Status: Abnormal   Collection Time: 02/10/23 10:36 AM  Result Value Ref Range   Hgb A1c MFr Bld 6.8 (H) 4.8 - 5.6 %    Comment: (NOTE)         Prediabetes: 5.7 - 6.4         Diabetes: >6.4         Glycemic control for adults with diabetes: <7.0    Mean Plasma Glucose  148 mg/dL    Comment: (NOTE) Performed At: [facility] [address] [place], [place] [id number] Jolene Schimke MD [id number]   HIV Antibody (routine testing w rflx)     Status: None   Collection Time: 02/10/23  1:45 PM  Result Value Ref Range   HIV Screen 4th Generation wRfx Non Reactive Non Reactive    Comment: Performed at [facility], [address] [place], [place] [address]  Glucose, capillary     Status: Abnormal   Collection Time: 02/10/23  6:04 PM  Result Value Ref Range   Glucose-Capillary 258 (H) 70 - 99 mg/dL    Comment: Glucose reference range applies only to samples taken after fasting for at least 8 hours.  Glucose, capillary     Status: None   Collection Time: 02/10/23 10:00 PM  Result Value Ref Range   Glucose-Capillary 91 70 - 99 mg/dL    Comment: Glucose reference range applies only to samples taken after fasting for at least 8 hours.  Glucose, capillary     Status: Abnormal   Collection Time: 02/11/23  8:05 AM  Result Value Ref Range   Glucose-Capillary 139 (H) 70 - 99 mg/dL    Comment: Glucose reference range applies only to samples taken after fasting for at least 8 hours.  ECHOCARDIOGRAM COMPLETE     Status: None   Collection Time: 02/11/23  8:50 AM  Result Value Ref Range   Weight 3,104.08 oz   Height 69 in   BP 152/81 mmHg   Ao pk vel 1.10 m/s   AV Area VTI 2.68 cm2   AR max vel 2.12 cm2   AV Mean grad 3.0 mmHg   AV Peak grad 4.8 mmHg   S' Lateral 2.70 cm  AV Area mean vel 2.08 cm2   Area-P 1/2 3.13 cm2   MV VTI 1.37 cm2   Est EF 55 - 60%   Glucose, capillary     Status: Abnormal   Collection Time: 02/11/23 11:54 AM  Result Value Ref Range   Glucose-Capillary 188 (H) 70 - 99 mg/dL    Comment: Glucose reference range applies only to samples taken after fasting for at least 8 hours.   Comment 1 Notify RN       03/01/2023    1:09 PM  GAD 7 : Generalized Anxiety Score  Nervous, Anxious, on Edge 3  Control/stop worrying 3   Worry too much - different things 3  Trouble relaxing 3  Restless 3  Easily annoyed or irritable 3  Afraid - awful might happen 3  Total GAD 7 Score 21   Flowsheet Row Office Visit from 03/01/2023 in [facility]  Thoughts that you would be better off dead, or of hurting yourself in some way Nearly every day  PHQ-9 Total Score 26          Assessment & Plan:  Start Effexor.  Return in one month to evaluate effectiveness of Effexor.   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   Total time spent: 25 minutes  [employer], NP  03/01/2023   This document may have been prepared by Dragon Voice Recognition software and as such may include unintentional dictation errors.

## 2023-03-26 IMAGING — CT CT RENAL STONE PROTOCOL
2 of 4 series · 16 of 46 positions shown, 18 images · non-contrast
Comparison: 05/30/2019

CLINICAL DATA: Dysuria and groin pain

EXAM:
CT ABDOMEN AND PELVIS WITHOUT CONTRAST
TECHNIQUE: Multidetector CT imaging of the abdomen and pelvis was performed
following the standard protocol without IV contrast.

[Series 2: stone full standard · axial · 0.86mm/px · z∈[-601,-121]mm · 13 of 106 slices shown, 15 images]
[im 5/106  soft-tissue]
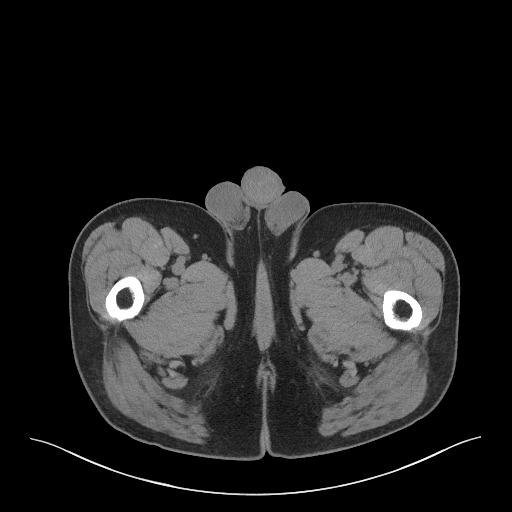
[im 5/106  bone]
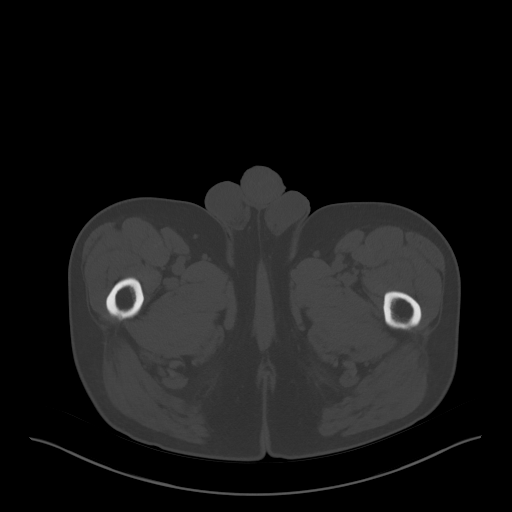
[im 13/106  soft-tissue]
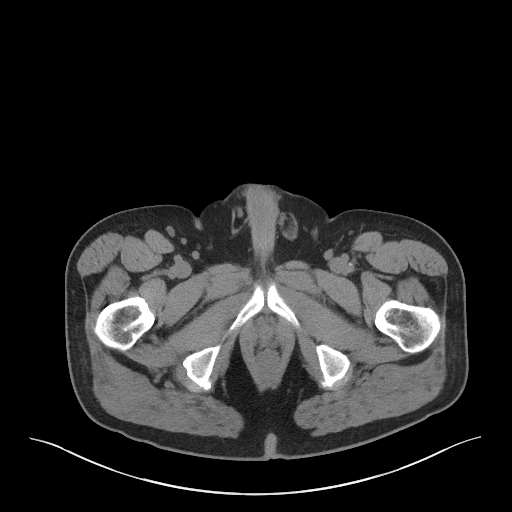
[im 22/106  soft-tissue]
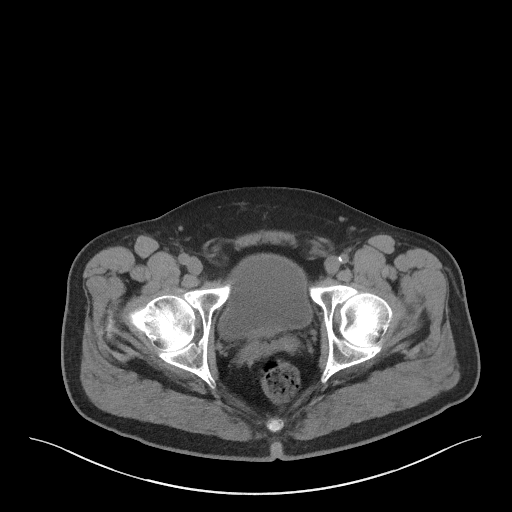
[im 30/106  soft-tissue]
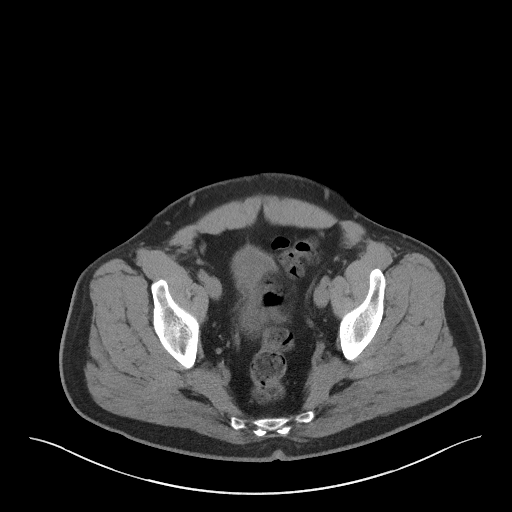
[im 38/106  soft-tissue]
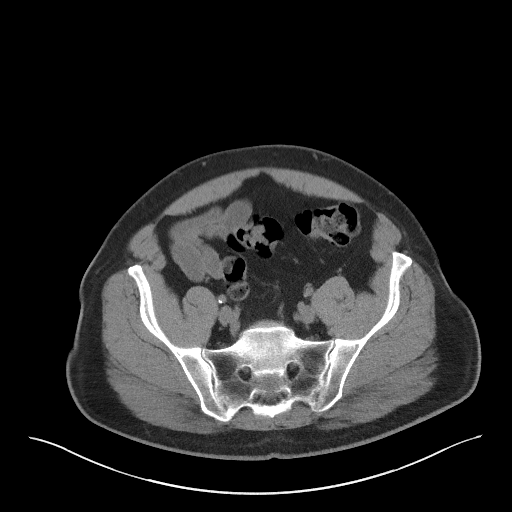
[im 47/106  soft-tissue]
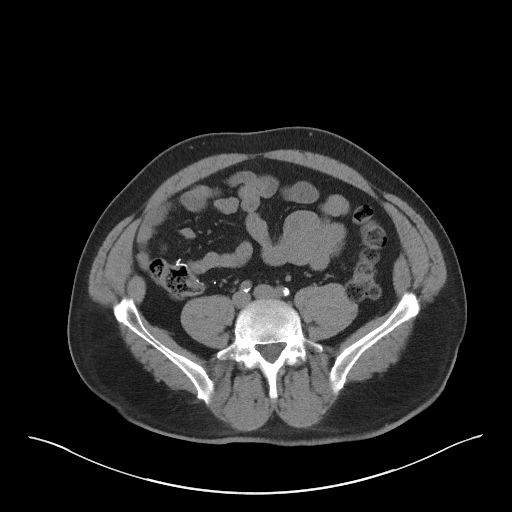
[im 55/106  soft-tissue]
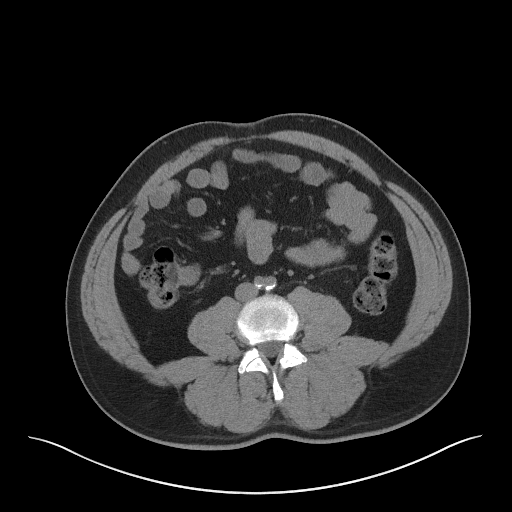
[im 59/106  soft-tissue]
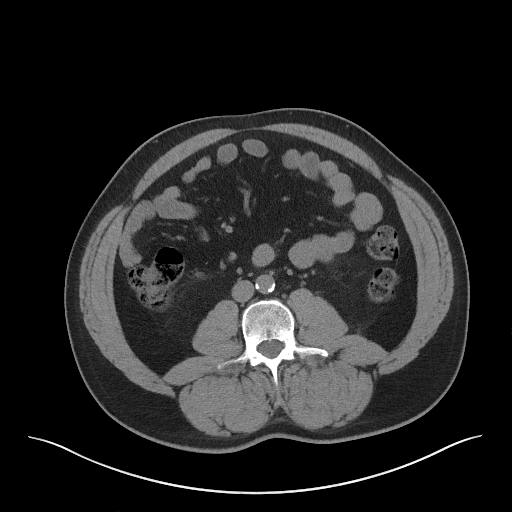
[im 68/106  soft-tissue]
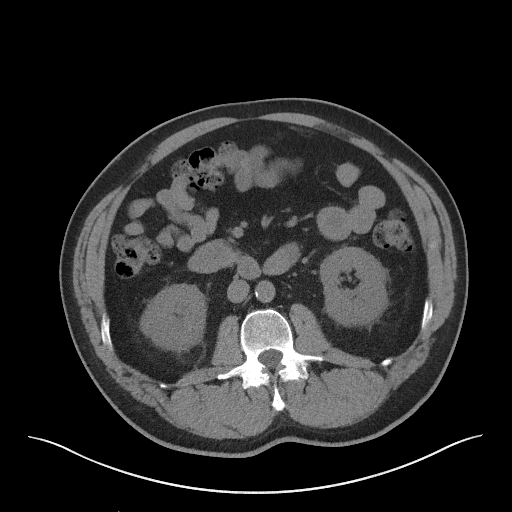
[im 68/106  bone]
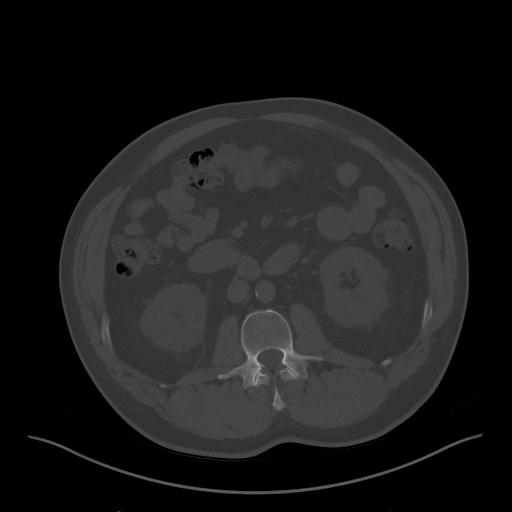
[im 76/106  soft-tissue]
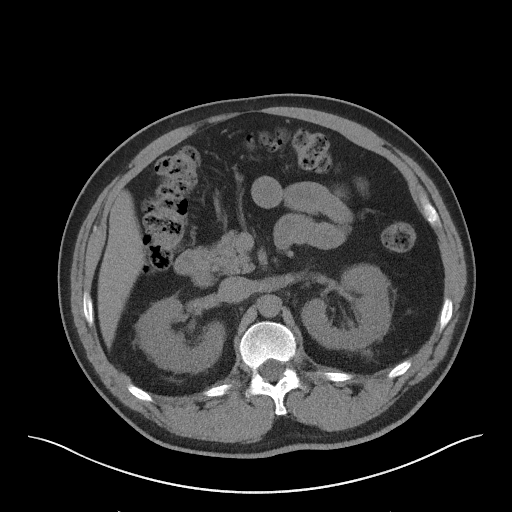
[im 85/106  soft-tissue]
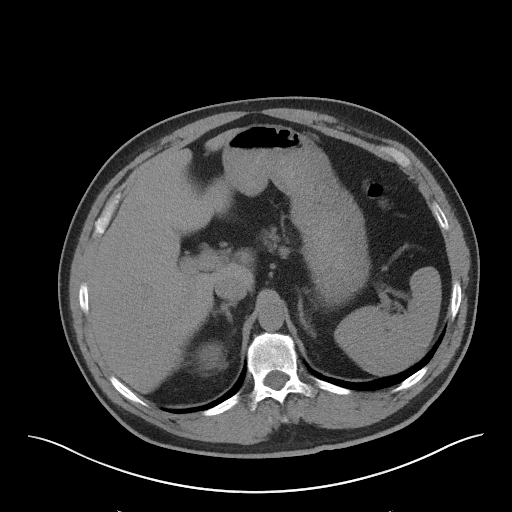
[im 93/106  soft-tissue]
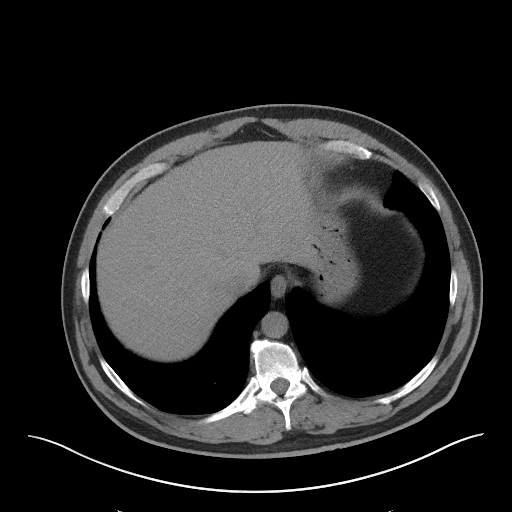
[im 101/106  soft-tissue]
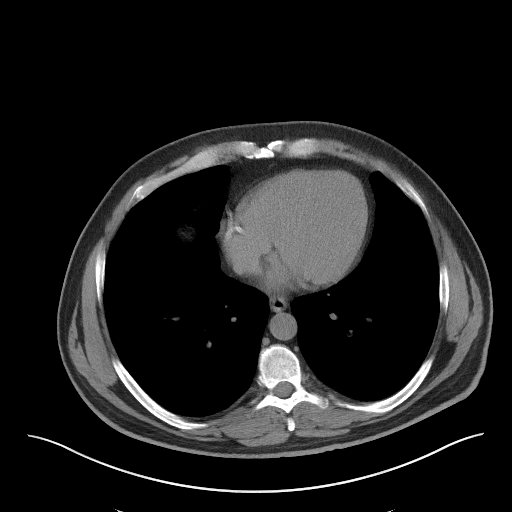

[Series 5: coronal · coronal · 0.76mm/px · 3 of 168 slices shown]
[im 56/168  soft-tissue]
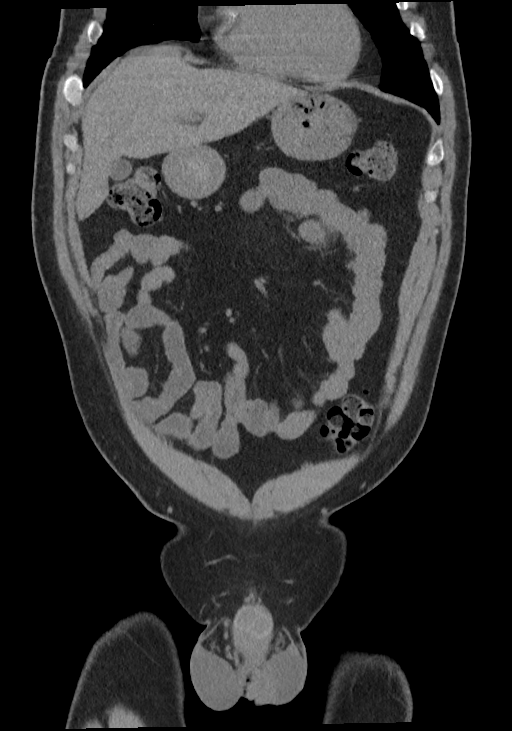
[im 75/168  soft-tissue]
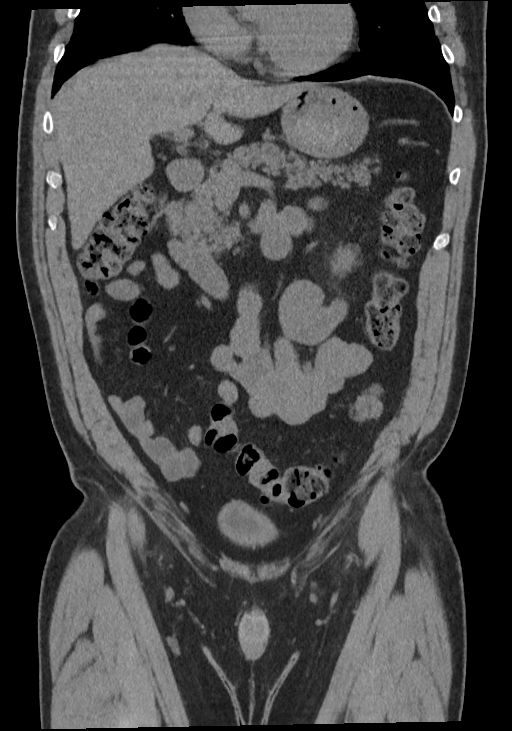
[im 93/168  soft-tissue]
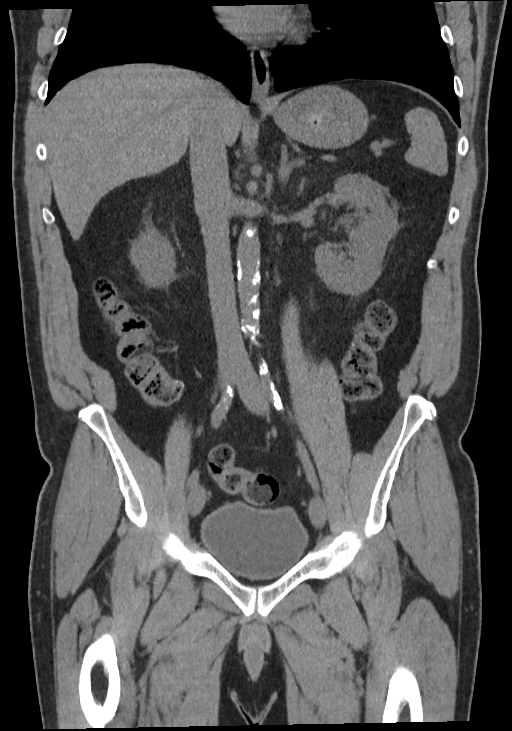

[16 of 46 positions shown; findings below may reference images not displayed]

FINDINGS: Lower chest: Right coronary artery atherosclerotic calcification.

Hepatobiliary: Mildly contracted gallbladder. Otherwise
unremarkable.

Pancreas: Unremarkable

Spleen: Unremarkable

Adrenals/Urinary Tract: Both adrenal glands appear normal. No
hydronephrosis or hydroureter. No urinary tract calculi identified.
Faint calcification adjacent to the ureter at the right iliac vessel
cross over is thought to be outside of the ureter.

Stomach/Bowel: Prior cholecystectomy. Prominent stool throughout the
colon favors constipation. No dilated small bowel.

Vascular/Lymphatic: Aortoiliac atherosclerotic vascular disease.

Reproductive: Dystrophic calcifications in the prostate gland.

Other: No supplemental non-categorized findings.

Musculoskeletal: No supplemental non-categorized findings.
IMPRESSION: 1. A specific cause for the patient's current symptoms is not
identified. No nephrolithiasis.
2.  Prominent stool throughout the colon favors constipation.
3. Aortic Atherosclerosis ([id number]). Right coronary artery
atherosclerotic calcification.

## 2023-04-05 ENCOUNTER — Encounter: Payer: Self-pay | Admitting: Cardiology

## 2023-04-05 ENCOUNTER — Ambulatory Visit (INDEPENDENT_AMBULATORY_CARE_PROVIDER_SITE_OTHER): Payer: 59 | Admitting: Cardiology

## 2023-04-05 VITALS — BP 152/90 | HR 70 | Ht 69.0 in | Wt 190.4 lb

## 2023-04-05 DIAGNOSIS — E1169 Type 2 diabetes mellitus with other specified complication: Secondary | ICD-10-CM

## 2023-04-05 DIAGNOSIS — F32A Depression, unspecified: Secondary | ICD-10-CM

## 2023-04-05 DIAGNOSIS — Z23 Encounter for immunization: Secondary | ICD-10-CM | POA: Diagnosis not present

## 2023-04-05 DIAGNOSIS — I10 Essential (primary) hypertension: Secondary | ICD-10-CM | POA: Diagnosis not present

## 2023-04-05 LAB — POCT CBG (FASTING - GLUCOSE)-MANUAL ENTRY: Glucose Fasting, POC: 124 mg/dL — AB (ref 70–99)

## 2023-04-05 MED ORDER — LOSARTAN POTASSIUM-HCTZ 100-25 MG PO TABS: 1.0000 | ORAL_TABLET | Freq: Every day | ORAL | 11 refills | Status: DC

## 2023-04-05 MED ORDER — VENLAFAXINE HCL ER 37.5 MG PO CP24: 37.5000 mg | ORAL_CAPSULE | Freq: Every day | ORAL | 7 refills | Status: DC

## 2023-04-05 NOTE — Progress Notes (Signed)
Established Patient Office Visit  Subjective:  Patient ID: Peter Galloway, male    DOB: May 28, 1971  Age: 52 y.o. MRN: [id number]  No chief complaint on file.   Patient in office for 1 month follow up. Patient reports feeling better, sleeping better since starting Effexor. Patient's blood pressure continues to be elevated. Patient states he was on losartan/hydrochlorothiazide at one time, not sure when or why the hydrochlorothiazide was stopped. Will change losartan to losartan/hydrochlorothiazide.     No other concerns at this time.   Past Medical History:  Diagnosis Date   CAD (coronary artery disease)    Chronic kidney disease    Diabetes mellitus without complication (HCC)    Hyperlipidemia    Hypertension    Myocardial infarction (HCC)    Stroke [facility])     Past Surgical History:  Procedure Laterality Date   APPENDECTOMY     CORONARY STENT INTERVENTION N/A 06/02/2017   Procedure: CORONARY STENT INTERVENTION;  Surgeon: Alwyn Pea, MD;  Location: [facility] INVASIVE CV LAB;  Service: Cardiovascular;  Laterality: N/A;   HERNIA REPAIR     LEFT HEART CATH AND CORONARY ANGIOGRAPHY N/A 06/02/2017   Procedure: LEFT HEART CATH AND CORONARY ANGIOGRAPHY;  Surgeon: Alwyn Pea, MD;  Location: [facility] INVASIVE CV LAB;  Service: Cardiovascular;  Laterality: N/A;    Social History   Socioeconomic History   Marital status: Single    Spouse name: Not on file   Number of children: Not on file   Years of education: Not on file   Highest education level: Not on file  Occupational History   Not on file  Tobacco Use   Smoking status: Former    Types: Cigarettes    Start date: 2021   Smokeless tobacco: Never  Substance and Sexual Activity   Alcohol use: Yes   Drug use: No   Sexual activity: Not Currently  Other Topics Concern   Not on file  Social History Narrative   Not on file   Social Determinants of Health   Financial Resource Strain: Not on file  Food Insecurity: No  Food Insecurity (02/10/2023)   Hunger Vital Sign    Worried About Running Out of Food in the Last Year: Never true    Ran Out of Food in the Last Year: Never true  Transportation Needs: No Transportation Needs (02/10/2023)   PRAPARE - Administrator, Civil Service (Medical): No    Lack of Transportation (Non-Medical): No  Physical Activity: Not on file  Stress: Not on file  Social Connections: Not on file  Intimate Partner Violence: Not At Risk (02/10/2023)   Humiliation, Afraid, Rape, and Kick questionnaire    Fear of Current or Ex-Partner: No    Emotionally Abused: No    Physically Abused: No    Sexually Abused: No    Family History  Problem Relation Age of Onset   Hypertension Mother    Diabetes Father     Allergies  Allergen Reactions   Lisinopril Cough    Other reaction(s): OTHER     Review of Systems  Constitutional: Negative.   HENT: Negative.    Eyes: Negative.   Respiratory: Negative.  Negative for shortness of breath.   Cardiovascular: Negative.  Negative for chest pain.  Gastrointestinal: Negative.  Negative for abdominal pain, constipation and diarrhea.  Genitourinary: Negative.   Musculoskeletal:  Negative for joint pain and myalgias.  Skin: Negative.   Neurological: Negative.  Negative for dizziness and headaches.  Endo/Heme/Allergies: Negative.   All other systems reviewed and are negative.      Objective:   BP (!) 152/90 (BP Location: Right Arm, Patient Position: Sitting, Cuff Size: Large)   Pulse 70   Ht 5\' 9"  (1.753 m)   Wt 190 lb 6.4 oz (86.4 kg)   SpO2 99%   BMI 28.12 kg/m   Vitals:   04/05/23 0920 04/05/23 0945  BP: (!) 149/90 (!) 152/90  Pulse: 70   Height: 5\' 9"  (1.753 m)   Weight: 190 lb 6.4 oz (86.4 kg)   SpO2: 99%   BMI (Calculated): 28.1     Physical Exam Nursing note reviewed.  Constitutional:      Appearance: Normal appearance. He is normal weight.  HENT:     Head: Normocephalic and atraumatic.     Nose:  Nose normal.     Mouth/Throat:     Mouth: Mucous membranes are moist.     Pharynx: Oropharynx is clear.  Eyes:     Extraocular Movements: Extraocular movements intact.     Conjunctiva/sclera: Conjunctivae normal.     Pupils: Pupils are equal, round, and reactive to light.  Cardiovascular:     Rate and Rhythm: Normal rate and regular rhythm.     Pulses: Normal pulses.     Heart sounds: Normal heart sounds.  Pulmonary:     Effort: Pulmonary effort is normal.     Breath sounds: Normal breath sounds.  Abdominal:     General: Abdomen is flat. Bowel sounds are normal.     Palpations: Abdomen is soft.  Musculoskeletal:        General: Normal range of motion.     Cervical back: Normal range of motion.  Skin:    General: Skin is warm and dry.  Neurological:     General: No focal deficit present.     Mental Status: He is alert and oriented to person, place, and time.  Psychiatric:        Mood and Affect: Mood normal.        Behavior: Behavior normal.        Thought Content: Thought content normal.        Judgment: Judgment normal.      Results for orders placed or performed in visit on 04/05/23  POCT CBG (Fasting - Glucose)  Result Value Ref Range   Glucose Fasting, POC 124 (A) 70 - 99 mg/dL    Recent Results (from the past 2160 hour(s))  CBG monitoring, ED     Status: Abnormal   Collection Time: 02/10/23 10:12 AM  Result Value Ref Range   Glucose-Capillary 132 (H) 70 - 99 mg/dL    Comment: Glucose reference range applies only to samples taken after fasting for at least 8 hours.   Comment 1 Notify RN    Comment 2 Document in Chart   Protime-INR     Status: None   Collection Time: 02/10/23 10:36 AM  Result Value Ref Range   Prothrombin Time 14.2 11.4 - 15.2 seconds   INR 1.1 0.8 - 1.2    Comment: (NOTE) INR goal varies based on device and disease states. Performed at [facility], [address] [place], [place] [address]   APTT     Status: None    Collection Time: 02/10/23 10:36 AM  Result Value Ref Range   aPTT 25 24 - 36 seconds    Comment: Performed at [facility], [address], [place], [place] [address]  CBC  Status: Abnormal   Collection Time: 02/10/23 10:36 AM  Result Value Ref Range   WBC 7.2 4.0 - 10.5 K/uL   RBC 4.59 4.22 - 5.81 MIL/uL   Hemoglobin 14.8 13.0 - 17.0 g/dL   HCT 16.1 09.6 - 04.5 %   MCV 95.4 80.0 - 100.0 fL   MCH 32.2 26.0 - 34.0 pg   MCHC 33.8 30.0 - 36.0 g/dL   RDW 40.9 81.1 - 91.4 %   Platelets 144 (L) 150 - 400 K/uL   nRBC 0.0 0.0 - 0.2 %    Comment: Performed at [facility], [address] [place], [place] [address]  Differential     Status: None   Collection Time: 02/10/23 10:36 AM  Result Value Ref Range   Neutrophils Relative % 71 %   Neutro Abs 5.1 1.7 - 7.7 K/uL   Lymphocytes Relative 19 %   Lymphs Abs 1.4 0.7 - 4.0 K/uL   Monocytes Relative 8 %   Monocytes Absolute 0.5 0.1 - 1.0 K/uL   Eosinophils Relative 2 %   Eosinophils Absolute 0.2 0.0 - 0.5 K/uL   Basophils Relative 0 %   Basophils Absolute 0.0 0.0 - 0.1 K/uL   Immature Granulocytes 0 %   Abs Immature Granulocytes 0.02 0.00 - 0.07 K/uL    Comment: Performed at [facility], [address] [place], [place] [address]  Comprehensive metabolic panel     Status: Abnormal   Collection Time: 02/10/23 10:36 AM  Result Value Ref Range   Sodium 136 135 - 145 mmol/L   Potassium 4.6 3.5 - 5.1 mmol/L   Chloride 105 98 - 111 mmol/L   CO2 22 22 - 32 mmol/L   Glucose, Bld 116 (H) 70 - 99 mg/dL    Comment: Glucose reference range applies only to samples taken after fasting for at least 8 hours.   BUN 26 (H) 6 - 20 mg/dL   Creatinine, Ser 8.65 0.61 - 1.24 mg/dL   Calcium 8.8 (L) 8.9 - 10.3 mg/dL   Total Protein 6.4 (L) 6.5 - 8.1 g/dL   Albumin 3.6 3.5 - 5.0 g/dL   AST 26 15 - 41 U/L   ALT 29 0 - 44 U/L   Alkaline Phosphatase 56 38 - 126 U/L   Total Bilirubin 0.6 0.3 - 1.2 mg/dL   GFR, Estimated  >78 >46 mL/min    Comment: (NOTE) Calculated using the CKD-EPI Creatinine Equation (2021)    Anion gap 9 5 - 15    Comment: Performed at [facility], [address], [place], [place] [address]  Ethanol     Status: None   Collection Time: 02/10/23 10:36 AM  Result Value Ref Range   Alcohol, Ethyl (B) <10 <10 mg/dL    Comment: (NOTE) Lowest detectable limit for serum alcohol is 10 mg/dL.  For medical purposes only. Performed at [facility], [address] [place], [place] [address]   Lipid panel     Status: Abnormal   Collection Time: 02/10/23 10:36 AM  Result Value Ref Range   Cholesterol 121 0 - 200 mg/dL   Triglycerides 244 (H) <150 mg/dL   HDL 36 (L) >01 mg/dL   Total CHOL/HDL Ratio 3.4 RATIO   VLDL 49 (H) 0 - 40 mg/dL   LDL Cholesterol 36 0 - 99 mg/dL    Comment:        Total Cholesterol/HDL:CHD Risk Coronary Heart Disease Risk Table  Men   Women  1/2 Average Risk   3.4   3.3  Average Risk       5.0   4.4  2 X Average Risk   9.6   7.1  3 X Average Risk  23.4   11.0        Use the calculated Patient Ratio above and the CHD Risk Table to determine the patient's CHD Risk.        ATP III CLASSIFICATION (LDL):  <100     mg/dL   Optimal  161-096  mg/dL   Near or Above                    Optimal  130-159  mg/dL   Borderline  045-409  mg/dL   High  >811     mg/dL   Very High Performed at [facility], [address] [place], [place] [address]   Hemoglobin A1c     Status: Abnormal   Collection Time: 02/10/23 10:36 AM  Result Value Ref Range   Hgb A1c MFr Bld 6.8 (H) 4.8 - 5.6 %    Comment: (NOTE)         Prediabetes: 5.7 - 6.4         Diabetes: >6.4         Glycemic control for adults with diabetes: <7.0    Mean Plasma Glucose 148 mg/dL    Comment: (NOTE) Performed At: [facility] [address] [place], [place] [id number] Jolene Schimke MD MV:[id number]   HIV Antibody (routine testing w rflx)      Status: None   Collection Time: 02/10/23  1:45 PM  Result Value Ref Range   HIV Screen 4th Generation wRfx Non Reactive Non Reactive    Comment: Performed at [facility], [address], [place], [place] [address]  Glucose, capillary     Status: Abnormal   Collection Time: 02/10/23  6:04 PM  Result Value Ref Range   Glucose-Capillary 258 (H) 70 - 99 mg/dL    Comment: Glucose reference range applies only to samples taken after fasting for at least 8 hours.  Glucose, capillary     Status: None   Collection Time: 02/10/23 10:00 PM  Result Value Ref Range   Glucose-Capillary 91 70 - 99 mg/dL    Comment: Glucose reference range applies only to samples taken after fasting for at least 8 hours.  Glucose, capillary     Status: Abnormal   Collection Time: 02/11/23  8:05 AM  Result Value Ref Range   Glucose-Capillary 139 (H) 70 - 99 mg/dL    Comment: Glucose reference range applies only to samples taken after fasting for at least 8 hours.  ECHOCARDIOGRAM COMPLETE     Status: None   Collection Time: 02/11/23  8:50 AM  Result Value Ref Range   Weight 3,104.08 oz   Height 69 in   BP 152/81 mmHg   Ao pk vel 1.10 m/s   AV Area VTI 2.68 cm2   AR max vel 2.12 cm2   AV Mean grad 3.0 mmHg   AV Peak grad 4.8 mmHg   S' Lateral 2.70 cm   AV Area mean vel 2.08 cm2   Area-P 1/2 3.13 cm2   MV VTI 1.37 cm2   Est EF 55 - 60%   Glucose, capillary     Status: Abnormal   Collection Time: 02/11/23 11:54 AM  Result Value Ref Range   Glucose-Capillary 188 (H) 70 -  99 mg/dL    Comment: Glucose reference range applies only to samples taken after fasting for at least 8 hours.   Comment 1 Notify RN   POCT CBG (Fasting - Glucose)     Status: Abnormal   Collection Time: 04/05/23  9:21 AM  Result Value Ref Range   Glucose Fasting, POC 124 (A) 70 - 99 mg/dL      Assessment & Plan:  Change losartan to losartan/hydrochlorothiazide.  Continue same dose of Effexor.   Problem List Items Addressed This  Visit       Cardiovascular and Mediastinum   Essential hypertension   Relevant Medications   losartan-hydrochlorothiazide (HYZAAR) 100-25 MG tablet     Endocrine   Type 2 diabetes mellitus (HCC) - Primary   Relevant Medications   losartan-hydrochlorothiazide (HYZAAR) 100-25 MG tablet   Other Relevant Orders   POCT CBG (Fasting - Glucose) (Completed)     Other   Depression   Relevant Medications   venlafaxine XR (EFFEXOR XR) 37.5 MG 24 hr capsule    Return in about 4 weeks (around 05/03/2023).   Total time spent: 25 minutes  [employer], NP  04/05/2023   This document may have been prepared by Dragon Voice Recognition software and as such may include unintentional dictation errors.

## 2023-04-19 DIAGNOSIS — I10 Essential (primary) hypertension: Secondary | ICD-10-CM | POA: Diagnosis not present

## 2023-04-19 DIAGNOSIS — E1159 Type 2 diabetes mellitus with other circulatory complications: Secondary | ICD-10-CM | POA: Diagnosis not present

## 2023-04-26 DIAGNOSIS — E782 Mixed hyperlipidemia: Secondary | ICD-10-CM | POA: Diagnosis not present

## 2023-04-26 DIAGNOSIS — I10 Essential (primary) hypertension: Secondary | ICD-10-CM | POA: Diagnosis not present

## 2023-04-26 DIAGNOSIS — E1159 Type 2 diabetes mellitus with other circulatory complications: Secondary | ICD-10-CM | POA: Diagnosis not present

## 2023-04-26 DIAGNOSIS — R809 Proteinuria, unspecified: Secondary | ICD-10-CM | POA: Diagnosis not present

## 2023-04-26 DIAGNOSIS — Z8673 Personal history of transient ischemic attack (TIA), and cerebral infarction without residual deficits: Secondary | ICD-10-CM | POA: Diagnosis not present

## 2023-04-26 DIAGNOSIS — E1129 Type 2 diabetes mellitus with other diabetic kidney complication: Secondary | ICD-10-CM | POA: Diagnosis not present

## 2023-04-26 DIAGNOSIS — E1142 Type 2 diabetes mellitus with diabetic polyneuropathy: Secondary | ICD-10-CM | POA: Diagnosis not present

## 2023-05-03 ENCOUNTER — Encounter: Payer: Self-pay | Admitting: Cardiology

## 2023-05-03 ENCOUNTER — Ambulatory Visit (INDEPENDENT_AMBULATORY_CARE_PROVIDER_SITE_OTHER): Payer: 59 | Admitting: Cardiology

## 2023-05-03 VITALS — BP 122/81 | HR 70 | Ht 69.0 in | Wt 194.0 lb

## 2023-05-03 DIAGNOSIS — E1122 Type 2 diabetes mellitus with diabetic chronic kidney disease: Secondary | ICD-10-CM | POA: Diagnosis not present

## 2023-05-03 DIAGNOSIS — E1142 Type 2 diabetes mellitus with diabetic polyneuropathy: Secondary | ICD-10-CM | POA: Diagnosis not present

## 2023-05-03 DIAGNOSIS — Z87891 Personal history of nicotine dependence: Secondary | ICD-10-CM | POA: Diagnosis not present

## 2023-05-03 DIAGNOSIS — Z7982 Long term (current) use of aspirin: Secondary | ICD-10-CM | POA: Diagnosis not present

## 2023-05-03 DIAGNOSIS — Z794 Long term (current) use of insulin: Secondary | ICD-10-CM | POA: Diagnosis not present

## 2023-05-03 DIAGNOSIS — Z85828 Personal history of other malignant neoplasm of skin: Secondary | ICD-10-CM | POA: Diagnosis not present

## 2023-05-03 DIAGNOSIS — E1159 Type 2 diabetes mellitus with other circulatory complications: Secondary | ICD-10-CM | POA: Diagnosis not present

## 2023-05-03 DIAGNOSIS — Z7984 Long term (current) use of oral hypoglycemic drugs: Secondary | ICD-10-CM | POA: Diagnosis not present

## 2023-05-03 DIAGNOSIS — I10 Essential (primary) hypertension: Secondary | ICD-10-CM

## 2023-05-03 DIAGNOSIS — I2510 Atherosclerotic heart disease of native coronary artery without angina pectoris: Secondary | ICD-10-CM | POA: Diagnosis not present

## 2023-05-03 DIAGNOSIS — Z8249 Family history of ischemic heart disease and other diseases of the circulatory system: Secondary | ICD-10-CM | POA: Diagnosis not present

## 2023-05-03 DIAGNOSIS — Z833 Family history of diabetes mellitus: Secondary | ICD-10-CM | POA: Diagnosis not present

## 2023-05-03 DIAGNOSIS — E785 Hyperlipidemia, unspecified: Secondary | ICD-10-CM | POA: Diagnosis not present

## 2023-05-03 NOTE — Progress Notes (Signed)
Established Patient Office Visit  Subjective:  Patient ID: Peter Galloway, male    DOB: April 12, 1971  Age: 52 y.o. MRN: [id number]  Chief Complaint  Patient presents with   Follow-up    Patient in office for 4 week follow up on blood pressure. Patient taking and tolerating hydrochlorothiazide. Denies dizziness. Blood pressure now well controlled. Continue same medications. Kidney function normal 04/19/2023.     No other concerns at this time.   Past Medical History:  Diagnosis Date   CAD (coronary artery disease)    Chronic kidney disease    Diabetes mellitus without complication (HCC)    Hyperlipidemia    Hypertension    Myocardial infarction (HCC)    Stroke [facility])     Past Surgical History:  Procedure Laterality Date   APPENDECTOMY     CORONARY STENT INTERVENTION N/A 06/02/2017   Procedure: CORONARY STENT INTERVENTION;  Surgeon: Alwyn Pea, MD;  Location: [facility] INVASIVE CV [facility];  Service: Cardiovascular;  Laterality: N/A;   HERNIA REPAIR     LEFT HEART CATH AND CORONARY ANGIOGRAPHY N/A 06/02/2017   Procedure: LEFT HEART CATH AND CORONARY ANGIOGRAPHY;  Surgeon: Alwyn Pea, MD;  Location: [facility] INVASIVE CV LAB;  Service: Cardiovascular;  Laterality: N/A;    Social History   Socioeconomic History   Marital status: Single    Spouse name: Not on file   Number of children: Not on file   Years of education: Not on file   Highest education level: Not on file  Occupational History   Not on file  Tobacco Use   Smoking status: Former    Types: Cigarettes    Start date: 2021   Smokeless tobacco: Never  Substance and Sexual Activity   Alcohol use: Yes   Drug use: No   Sexual activity: Not Currently  Other Topics Concern   Not on file  Social History Narrative   Not on file   Social Determinants of Health   Financial Resource Strain: Not on file  Food Insecurity: No Food Insecurity (02/10/2023)   Hunger Vital Sign    Worried About Running Out of Food in the  Last Year: Never true    Ran Out of Food in the Last Year: Never true  Transportation Needs: No Transportation Needs (02/10/2023)   PRAPARE - Administrator, Civil Service (Medical): No    Lack of Transportation (Non-Medical): No  Physical Activity: Not on file  Stress: Not on file  Social Connections: Not on file  Intimate Partner Violence: Not At Risk (02/10/2023)   Humiliation, Afraid, Rape, and Kick questionnaire    Fear of Current or Ex-Partner: No    Emotionally Abused: No    Physically Abused: No    Sexually Abused: No    Family History  Problem Relation Age of Onset   Hypertension Mother    Diabetes Father     Allergies  Allergen Reactions   Lisinopril Cough    Other reaction(s): OTHER     Outpatient Medications Prior to Visit  Medication Sig   aspirin EC 81 MG tablet Take 81 mg by mouth daily.   clopidogrel (PLAVIX) 75 MG tablet Take 1 tablet (75 mg total) by mouth daily with breakfast.   gabapentin (NEURONTIN) 300 MG capsule Take 300 mg by mouth 3 (three) times daily as needed.   glipiZIDE (GLUCOTROL) 10 MG tablet Take 10 mg by mouth daily before breakfast.   Insulin Aspart FlexPen (NOVOLOG) 100 UNIT/ML Inject 10-50 Units into  the skin 3 (three) times daily before meals.   Insulin Glargine (BASAGLAR KWIKPEN) 100 UNIT/ML Inject 40-50 Units into the skin at bedtime.   JARDIANCE 25 MG TABS tablet Take 25 mg by mouth daily.   losartan-hydrochlorothiazide (HYZAAR) 100-25 MG tablet Take 1 tablet by mouth daily.   metFORMIN (GLUCOPHAGE) 1000 MG tablet Take 1 tablet by mouth 2 (two) times daily with a meal.   metoprolol tartrate (LOPRESSOR) 50 MG tablet Take 1 tablet by mouth 2 (two) times daily.   Omega-3 Fatty Acids (FISH OIL) 300 MG CAPS Take by mouth.   rosuvastatin (CRESTOR) 20 MG tablet Take 20 mg by mouth at bedtime.   venlafaxine XR (EFFEXOR XR) 37.5 MG 24 hr capsule Take 1 capsule (37.5 mg total) by mouth daily.   No facility-administered medications  prior to visit.    Review of Systems  Constitutional: Negative.   HENT: Negative.    Eyes: Negative.   Respiratory: Negative.  Negative for shortness of breath.   Cardiovascular: Negative.  Negative for chest pain.  Gastrointestinal: Negative.  Negative for abdominal pain, constipation and diarrhea.  Genitourinary: Negative.   Musculoskeletal:  Negative for joint pain and myalgias.  Skin: Negative.   Neurological: Negative.  Negative for dizziness and headaches.  Endo/Heme/Allergies: Negative.   All other systems reviewed and are negative.      Objective:   BP 122/81   Pulse 70   Ht 5\' 9"  (1.753 m)   Wt 194 lb (88 kg)   SpO2 98%   BMI 28.65 kg/m   Vitals:   05/03/23 0918  BP: 122/81  Pulse: 70  Height: 5\' 9"  (1.753 m)  Weight: 194 lb (88 kg)  SpO2: 98%  BMI (Calculated): 28.64    Physical Exam Nursing note reviewed.  Constitutional:      Appearance: Normal appearance. He is normal weight.  HENT:     Head: Normocephalic and atraumatic.     Nose: Nose normal.     Mouth/Throat:     Mouth: Mucous membranes are moist.     Pharynx: Oropharynx is clear.  Eyes:     Extraocular Movements: Extraocular movements intact.     Conjunctiva/sclera: Conjunctivae normal.     Pupils: Pupils are equal, round, and reactive to light.  Cardiovascular:     Rate and Rhythm: Normal rate and regular rhythm.     Pulses: Normal pulses.     Heart sounds: Normal heart sounds.  Pulmonary:     Effort: Pulmonary effort is normal.     Breath sounds: Normal breath sounds.  Abdominal:     General: Abdomen is flat. Bowel sounds are normal.     Palpations: Abdomen is soft.  Musculoskeletal:        General: Normal range of motion.     Cervical back: Normal range of motion.  Skin:    General: Skin is warm and dry.  Neurological:     General: No focal deficit present.     Mental Status: He is alert and oriented to person, place, and time.  Psychiatric:        Mood and Affect: Mood  normal.        Behavior: Behavior normal.        Thought Content: Thought content normal.        Judgment: Judgment normal.      No results found for any visits on 05/03/23.  Recent Results (from the past 2160 hour(s))  CBG monitoring, ED     Status:  Abnormal   Collection Time: 02/10/23 10:12 AM  Result Value Ref Range   Glucose-Capillary 132 (H) 70 - 99 mg/dL    Comment: Glucose reference range applies only to samples taken after fasting for at least 8 hours.   Comment 1 Notify RN    Comment 2 Document in Chart   Protime-INR     Status: None   Collection Time: 02/10/23 10:36 AM  Result Value Ref Range   Prothrombin Time 14.2 11.4 - 15.2 seconds   INR 1.1 0.8 - 1.2    Comment: (NOTE) INR goal varies based on device and disease states. Performed at [facility], [address] [place], [place] [address]   APTT     Status: None   Collection Time: 02/10/23 10:36 AM  Result Value Ref Range   aPTT 25 24 - 36 seconds    Comment: Performed at [facility], [address] [place], [place] [address]  CBC     Status: Abnormal   Collection Time: 02/10/23 10:36 AM  Result Value Ref Range   WBC 7.2 4.0 - 10.5 K/uL   RBC 4.59 4.22 - 5.81 MIL/uL   Hemoglobin 14.8 13.0 - 17.0 g/dL   HCT 46.9 62.9 - 52.8 %   MCV 95.4 80.0 - 100.0 fL   MCH 32.2 26.0 - 34.0 pg   MCHC 33.8 30.0 - 36.0 g/dL   RDW 41.3 24.4 - 01.0 %   Platelets 144 (L) 150 - 400 K/uL   nRBC 0.0 0.0 - 0.2 %    Comment: Performed at [facility], [address] [place], [place] [address]  Differential     Status: None   Collection Time: 02/10/23 10:36 AM  Result Value Ref Range   Neutrophils Relative % 71 %   Neutro Abs 5.1 1.7 - 7.7 K/uL   Lymphocytes Relative 19 %   Lymphs Abs 1.4 0.7 - 4.0 K/uL   Monocytes Relative 8 %   Monocytes Absolute 0.5 0.1 - 1.0 K/uL   Eosinophils Relative 2 %   Eosinophils Absolute 0.2 0.0 - 0.5 K/uL   Basophils Relative 0 %   Basophils Absolute 0.0 0.0  - 0.1 K/uL   Immature Granulocytes 0 %   Abs Immature Granulocytes 0.02 0.00 - 0.07 K/uL    Comment: Performed at [facility], [address] [place], [place] [address]  Comprehensive metabolic panel     Status: Abnormal   Collection Time: 02/10/23 10:36 AM  Result Value Ref Range   Sodium 136 135 - 145 mmol/L   Potassium 4.6 3.5 - 5.1 mmol/L   Chloride 105 98 - 111 mmol/L   CO2 22 22 - 32 mmol/L   Glucose, Bld 116 (H) 70 - 99 mg/dL    Comment: Glucose reference range applies only to samples taken after fasting for at least 8 hours.   BUN 26 (H) 6 - 20 mg/dL   Creatinine, Ser 3.47 0.61 - 1.24 mg/dL   Calcium 8.8 (L) 8.9 - 10.3 mg/dL   Total Protein 6.4 (L) 6.5 - 8.1 g/dL   Albumin 3.6 3.5 - 5.0 g/dL   AST 26 15 - 41 U/L   ALT 29 0 - 44 U/L   Alkaline Phosphatase 56 38 - 126 U/L   Total Bilirubin 0.6 0.3 - 1.2 mg/dL   GFR, Estimated >42 >59 mL/min    Comment: (NOTE) Calculated using the CKD-EPI Creatinine Equation (2021)    Anion gap 9 5 - 15  Comment: Performed at [facility], [address] [place], [place] [address]  Ethanol     Status: None   Collection Time: 02/10/23 10:36 AM  Result Value Ref Range   Alcohol, Ethyl (B) <10 <10 mg/dL    Comment: (NOTE) Lowest detectable limit for serum alcohol is 10 mg/dL.  For medical purposes only. Performed at [facility], [address] [place], [place] [address]   Lipid panel     Status: Abnormal   Collection Time: 02/10/23 10:36 AM  Result Value Ref Range   Cholesterol 121 0 - 200 mg/dL   Triglycerides 595 (H) <150 mg/dL   HDL 36 (L) >63 mg/dL   Total CHOL/HDL Ratio 3.4 RATIO   VLDL 49 (H) 0 - 40 mg/dL   LDL Cholesterol 36 0 - 99 mg/dL    Comment:        Total Cholesterol/HDL:CHD Risk Coronary Heart Disease Risk Table                     Men   Women  1/2 Average Risk   3.4   3.3  Average Risk       5.0   4.4  2 X Average Risk   9.6   7.1  3 X Average Risk  23.4   11.0        Use  the calculated Patient Ratio above and the CHD Risk Table to determine the patient's CHD Risk.        ATP III CLASSIFICATION (LDL):  <100     mg/dL   Optimal  875-643  mg/dL   Near or Above                    Optimal  130-159  mg/dL   Borderline  329-518  mg/dL   High  >841     mg/dL   Very High Performed at [facility] [address] [place], [place] [address]   Hemoglobin A1c     Status: Abnormal   Collection Time: 02/10/23 10:36 AM  Result Value Ref Range   Hgb A1c MFr Bld 6.8 (H) 4.8 - 5.6 %    Comment: (NOTE)         Prediabetes: 5.7 - 6.4         Diabetes: >6.4         Glycemic control for adults with diabetes: <7.0    Mean Plasma Glucose 148 mg/dL    Comment: (NOTE) Performed At: [facility] [address] [place], [place] [id number] Jolene Schimke MD [id number]   HIV Antibody (routine testing w rflx)     Status: None   Collection Time: 02/10/23  1:45 PM  Result Value Ref Range   HIV Screen 4th Generation wRfx Non Reactive Non Reactive    Comment: Performed at [facility], [address] [place], [place] [address]  Glucose, capillary     Status: Abnormal   Collection Time: 02/10/23  6:04 PM  Result Value Ref Range   Glucose-Capillary 258 (H) 70 - 99 mg/dL    Comment: Glucose reference range applies only to samples taken after fasting for at least 8 hours.  Glucose, capillary     Status: None   Collection Time: 02/10/23 10:00 PM  Result Value Ref Range   Glucose-Capillary 91 70 - 99 mg/dL    Comment: Glucose reference range applies only to samples taken after fasting for at least 8 hours.  Glucose, capillary  Status: Abnormal   Collection Time: 02/11/23  8:05 AM  Result Value Ref Range   Glucose-Capillary 139 (H) 70 - 99 mg/dL    Comment: Glucose reference range applies only to samples taken after fasting for at least 8 hours.  ECHOCARDIOGRAM COMPLETE     Status: None   Collection Time: 02/11/23  8:50 AM  Result Value Ref  Range   Weight 3,104.08 oz   Height 69 in   BP 152/81 mmHg   Ao pk vel 1.10 m/s   AV Area VTI 2.68 cm2   AR max vel 2.12 cm2   AV Mean grad 3.0 mmHg   AV Peak grad 4.8 mmHg   S' Lateral 2.70 cm   AV Area mean vel 2.08 cm2   Area-P 1/2 3.13 cm2   MV VTI 1.37 cm2   Est EF 55 - 60%   Glucose, capillary     Status: Abnormal   Collection Time: 02/11/23 11:54 AM  Result Value Ref Range   Glucose-Capillary 188 (H) 70 - 99 mg/dL    Comment: Glucose reference range applies only to samples taken after fasting for at least 8 hours.   Comment 1 Notify RN   POCT CBG (Fasting - Glucose)     Status: Abnormal   Collection Time: 04/05/23  9:21 AM  Result Value Ref Range   Glucose Fasting, POC 124 (A) 70 - 99 mg/dL      Assessment & Plan:  Continue same medications.   Problem List Items Addressed This Visit       Cardiovascular and Mediastinum   Essential hypertension - Primary    Return in about 2 months (around 07/04/2023) for with fasting labs prior.   Total time spent: 25 minutes  Google, NP  05/03/2023   This document may have been prepared by Dragon Voice Recognition software and as such may include unintentional dictation errors.

## 2023-06-21 DIAGNOSIS — N189 Chronic kidney disease, unspecified: Secondary | ICD-10-CM | POA: Diagnosis not present

## 2023-06-21 DIAGNOSIS — Z8673 Personal history of transient ischemic attack (TIA), and cerebral infarction without residual deficits: Secondary | ICD-10-CM | POA: Diagnosis not present

## 2023-06-21 DIAGNOSIS — I639 Cerebral infarction, unspecified: Secondary | ICD-10-CM | POA: Diagnosis not present

## 2023-06-21 DIAGNOSIS — E1122 Type 2 diabetes mellitus with diabetic chronic kidney disease: Secondary | ICD-10-CM | POA: Diagnosis not present

## 2023-06-21 DIAGNOSIS — I10 Essential (primary) hypertension: Secondary | ICD-10-CM | POA: Diagnosis not present

## 2023-06-21 DIAGNOSIS — I2510 Atherosclerotic heart disease of native coronary artery without angina pectoris: Secondary | ICD-10-CM | POA: Diagnosis not present

## 2023-06-21 DIAGNOSIS — R4781 Slurred speech: Secondary | ICD-10-CM | POA: Diagnosis not present

## 2023-06-21 DIAGNOSIS — I25119 Atherosclerotic heart disease of native coronary artery with unspecified angina pectoris: Secondary | ICD-10-CM | POA: Diagnosis not present

## 2023-06-25 DIAGNOSIS — J3489 Other specified disorders of nose and nasal sinuses: Secondary | ICD-10-CM | POA: Diagnosis not present

## 2023-06-25 DIAGNOSIS — R059 Cough, unspecified: Secondary | ICD-10-CM | POA: Diagnosis not present

## 2023-06-25 DIAGNOSIS — J101 Influenza due to other identified influenza virus with other respiratory manifestations: Secondary | ICD-10-CM | POA: Diagnosis not present

## 2023-06-25 DIAGNOSIS — R0981 Nasal congestion: Secondary | ICD-10-CM | POA: Diagnosis not present

## 2023-07-05 ENCOUNTER — Ambulatory Visit: Payer: 59 | Admitting: Cardiology

## 2023-07-05 ENCOUNTER — Encounter: Payer: Self-pay | Admitting: Cardiology

## 2023-07-05 VITALS — BP 130/78 | HR 72 | Ht 69.0 in | Wt 184.0 lb

## 2023-07-05 DIAGNOSIS — I10 Essential (primary) hypertension: Secondary | ICD-10-CM | POA: Diagnosis not present

## 2023-07-05 DIAGNOSIS — E7800 Pure hypercholesterolemia, unspecified: Secondary | ICD-10-CM

## 2023-07-05 DIAGNOSIS — F17200 Nicotine dependence, unspecified, uncomplicated: Secondary | ICD-10-CM

## 2023-07-05 DIAGNOSIS — E1169 Type 2 diabetes mellitus with other specified complication: Secondary | ICD-10-CM

## 2023-07-05 NOTE — Progress Notes (Signed)
Established Patient Office Visit  Subjective:  Patient ID: Peter Galloway, male    DOB: Aug 29, 1970  Age: 53 y.o. MRN: [id number]  Chief Complaint  Patient presents with   Follow-up    2 Months Lab Results    Patient in office for 2 month follow up. Patient doing well. No complaints today. Patient seeing endo for his DM. Blood pressure well controlled. Following with cardiology. Up to date on health maintenance exams.     No other concerns at this time.   Past Medical History:  Diagnosis Date   CAD (coronary artery disease)    Chronic kidney disease    Diabetes mellitus without complication (HCC)    Hyperlipidemia    Hypertension    Myocardial infarction (HCC)    Stroke [facility])     Past Surgical History:  Procedure Laterality Date   APPENDECTOMY     CORONARY STENT INTERVENTION N/A 06/02/2017   Procedure: CORONARY STENT INTERVENTION;  Surgeon: Alwyn Pea, MD;  Location: [facility] INV[facility] CV [facility];  Service: Cardiovascular;  Laterality: N/A;   HERNIA REPAIR     LEFT HEART CATH AND CORONARY ANGIOGRAPHY N/A 06/02/2017   Procedure: LEFT HEART CATH AND CORONARY ANGIOGRAPHY;  Surgeon: Alwyn Pea, MD;  Location: [facility] INVAS[facility] CV LAB;  Service: Cardiovascular;  Laterality: N/A;    Social History   Socioeconomic History   Marital status: Single    Spouse name: Not on file   Number of children: Not on file   Years of education: Not on file   Highest education level: Not on file  Occupational History   Not on file  Tobacco Use   Smoking status: Former    Types: Cigarettes    Start date: 2021   Smokeless tobacco: Never  Substance and Sexual Activity   Alcohol use: Yes   Drug use: No   Sexual activity: Not Currently  Other Topics Concern   Not on file  Social History Narrative   Not on file   Social Drivers of Health   Financial Resource Strain: Not on file  Food Insecurity: No Food Insecurity (02/10/2023)   Hunger Vital Sign    Worried About Running Out of  Food in the Last Year: Never true    Ran Out of Food in the Last Year: Never true  Transportation Needs: No Transportation Needs (02/10/2023)   PRAPARE - Administrator, Civil Service (Medical): No    Lack of Transportation (Non-Medical): No  Physical Activity: Not on file  Stress: Not on file  Social Connections: Not on file  Intimate Partner Violence: Not At Risk (02/10/2023)   Humiliation, Afraid, Rape, and Kick questionnaire    Fear of Current or Ex-Partner: No    Emotionally Abused: No    Physically Abused: No    Sexually Abused: No    Family History  Problem Relation Age of Onset   Hypertension Mother    Diabetes Father     Allergies  Allergen Reactions   Lisinopril Cough    Other reaction(s): OTHER     Outpatient Medications Prior to Visit  Medication Sig   aspirin EC 81 MG tablet Take 81 mg by mouth daily.   clopidogrel (PLAVIX) 75 MG tablet Take 1 tablet (75 mg total) by mouth daily with breakfast.   gabapentin (NEURONTIN) 300 MG capsule Take 300 mg by mouth 3 (three) times daily as needed.   glipiZIDE (GLUCOTROL) 10 MG tablet Take 10 mg by mouth daily before breakfast.  Insulin Aspart FlexPen (NOVOLOG) 100 UNIT/ML Inject 10-50 Units into the skin 3 (three) times daily before meals.   Insulin Glargine (BASAGLAR KWIKPEN) 100 UNIT/ML Inject 40-50 Units into the skin at bedtime.   JARDIANCE 25 MG TABS tablet Take 25 mg by mouth daily.   losartan-hydrochlorothiazide (HYZAAR) 100-25 MG tablet Take 1 tablet by mouth daily.   metFORMIN (GLUCOPHAGE) 1000 MG tablet Take 1 tablet by mouth 2 (two) times daily with a meal.   metoprolol tartrate (LOPRESSOR) 50 MG tablet Take 1 tablet by mouth 2 (two) times daily.   Omega-3 Fatty Acids (FISH OIL) 300 MG CAPS Take by mouth.   rosuvastatin (CRESTOR) 20 MG tablet Take 20 mg by mouth at bedtime.   venlafaxine XR (EFFEXOR XR) 37.5 MG 24 hr capsule Take 1 capsule (37.5 mg total) by mouth daily.   No facility-administered  medications prior to visit.    Review of Systems  Constitutional: Negative.   HENT: Negative.    Eyes: Negative.   Respiratory: Negative.  Negative for shortness of breath.   Cardiovascular: Negative.  Negative for chest pain.  Gastrointestinal: Negative.  Negative for abdominal pain, constipation and diarrhea.  Genitourinary: Negative.   Musculoskeletal:  Negative for joint pain and myalgias.  Skin: Negative.   Neurological: Negative.  Negative for dizziness and headaches.  Endo/Heme/Allergies: Negative.   All other systems reviewed and are negative.      Objective:   BP 130/78   Pulse 72   Ht 5\' 9"  (1.753 m)   Wt 184 lb (83.5 kg)   SpO2 94%   BMI 27.17 kg/m   Vitals:   07/05/23 0928 07/05/23 0944  BP: 130/78   Pulse: (!) 47 72  Height: 5\' 9"  (1.753 m)   Weight: 184 lb (83.5 kg)   SpO2: 94%   BMI (Calculated): 27.16     Physical Exam Nursing note reviewed.  Constitutional:      Appearance: Normal appearance. He is normal weight.  HENT:     Head: Normocephalic and atraumatic.     Nose: Nose normal.     Mouth/Throat:     Mouth: Mucous membranes are moist.     Pharynx: Oropharynx is clear.  Eyes:     Extraocular Movements: Extraocular movements intact.     Conjunctiva/sclera: Conjunctivae normal.     Pupils: Pupils are equal, round, and reactive to light.  Cardiovascular:     Rate and Rhythm: Normal rate and regular rhythm.     Pulses: Normal pulses.     Heart sounds: Normal heart sounds.  Pulmonary:     Effort: Pulmonary effort is normal.     Breath sounds: Normal breath sounds.  Abdominal:     General: Abdomen is flat. Bowel sounds are normal.     Palpations: Abdomen is soft.  Musculoskeletal:        General: Normal range of motion.     Cervical back: Normal range of motion.  Skin:    General: Skin is warm and dry.  Neurological:     General: No focal deficit present.     Mental Status: He is alert and oriented to person, place, and time.   Psychiatric:        Mood and Affect: Mood normal.        Behavior: Behavior normal.        Thought Content: Thought content normal.        Judgment: Judgment normal.      No results found for any visits  on 07/05/23.  No results found for this or any previous visit (from the past 2160 hours).    Assessment & Plan:  Keep appointments with specialists. Continue same medications.   Problem List Items Addressed This Visit       Cardiovascular and Mediastinum   Essential hypertension - Primary     Endocrine   Type 2 diabetes mellitus (HCC)     Other   Pure hypercholesterolemia   Tobacco use disorder    Return in about 4 weeks (around 08/02/2023).   Total time spent: 25 minutes  Google, NP  07/05/2023   This document may have been prepared by Dragon Voice Recognition software and as such may include unintentional dictation errors.

## 2023-08-30 DIAGNOSIS — B9689 Other specified bacterial agents as the cause of diseases classified elsewhere: Secondary | ICD-10-CM | POA: Diagnosis not present

## 2023-08-30 DIAGNOSIS — J3489 Other specified disorders of nose and nasal sinuses: Secondary | ICD-10-CM | POA: Diagnosis not present

## 2023-08-30 DIAGNOSIS — I10 Essential (primary) hypertension: Secondary | ICD-10-CM | POA: Diagnosis not present

## 2023-08-30 DIAGNOSIS — J019 Acute sinusitis, unspecified: Secondary | ICD-10-CM | POA: Diagnosis not present

## 2023-09-01 DIAGNOSIS — E1129 Type 2 diabetes mellitus with other diabetic kidney complication: Secondary | ICD-10-CM | POA: Diagnosis not present

## 2023-09-01 DIAGNOSIS — R809 Proteinuria, unspecified: Secondary | ICD-10-CM | POA: Diagnosis not present

## 2023-09-08 DIAGNOSIS — J019 Acute sinusitis, unspecified: Secondary | ICD-10-CM | POA: Diagnosis not present

## 2023-09-08 DIAGNOSIS — B9689 Other specified bacterial agents as the cause of diseases classified elsewhere: Secondary | ICD-10-CM | POA: Diagnosis not present

## 2023-09-08 DIAGNOSIS — R0981 Nasal congestion: Secondary | ICD-10-CM | POA: Diagnosis not present

## 2023-09-10 DIAGNOSIS — E1159 Type 2 diabetes mellitus with other circulatory complications: Secondary | ICD-10-CM | POA: Diagnosis not present

## 2023-09-10 DIAGNOSIS — E113293 Type 2 diabetes mellitus with mild nonproliferative diabetic retinopathy without macular edema, bilateral: Secondary | ICD-10-CM | POA: Diagnosis not present

## 2023-09-10 DIAGNOSIS — I10 Essential (primary) hypertension: Secondary | ICD-10-CM | POA: Diagnosis not present

## 2023-09-10 DIAGNOSIS — E1129 Type 2 diabetes mellitus with other diabetic kidney complication: Secondary | ICD-10-CM | POA: Diagnosis not present

## 2023-09-10 DIAGNOSIS — E782 Mixed hyperlipidemia: Secondary | ICD-10-CM | POA: Diagnosis not present

## 2023-09-10 DIAGNOSIS — E1142 Type 2 diabetes mellitus with diabetic polyneuropathy: Secondary | ICD-10-CM | POA: Diagnosis not present

## 2023-09-10 DIAGNOSIS — R809 Proteinuria, unspecified: Secondary | ICD-10-CM | POA: Diagnosis not present

## 2023-09-10 DIAGNOSIS — Z794 Long term (current) use of insulin: Secondary | ICD-10-CM | POA: Diagnosis not present

## 2023-09-30 DIAGNOSIS — E119 Type 2 diabetes mellitus without complications: Secondary | ICD-10-CM | POA: Diagnosis not present

## 2023-11-01 ENCOUNTER — Encounter: Payer: Self-pay | Admitting: Cardiology

## 2023-11-01 ENCOUNTER — Ambulatory Visit: Payer: 59 | Admitting: Cardiology

## 2023-11-01 VITALS — BP 120/80 | HR 58 | Ht 69.0 in | Wt 195.4 lb

## 2023-11-01 DIAGNOSIS — E1169 Type 2 diabetes mellitus with other specified complication: Secondary | ICD-10-CM

## 2023-11-01 DIAGNOSIS — E7800 Pure hypercholesterolemia, unspecified: Secondary | ICD-10-CM | POA: Diagnosis not present

## 2023-11-01 DIAGNOSIS — Z1329 Encounter for screening for other suspected endocrine disorder: Secondary | ICD-10-CM

## 2023-11-01 DIAGNOSIS — I10 Essential (primary) hypertension: Secondary | ICD-10-CM

## 2023-11-01 DIAGNOSIS — E1122 Type 2 diabetes mellitus with diabetic chronic kidney disease: Secondary | ICD-10-CM | POA: Diagnosis not present

## 2023-11-01 MED ORDER — VENLAFAXINE HCL ER 37.5 MG PO CP24: 37.5000 mg | ORAL_CAPSULE | Freq: Every day | ORAL | 1 refills | Status: DC

## 2023-11-01 NOTE — Progress Notes (Signed)
 Established Patient Office Visit  Subjective:  Patient ID: Peter Galloway, male    DOB: 06-11-1970  Age: 53 y.o. MRN: [id number]  Chief Complaint  Patient presents with   Follow-up    4 month follow up    Patient in office for 4 month follow up. Patient did not have lab work done. Patient doing well, no complaints today. Blood pressure well controlled.  Patient reports Effexor  37.5 mg effective, no need to increase dose.  Will get lab work today.  Unable to give a urine specimen today.     No other concerns at this time.   Past Medical History:  Diagnosis Date   CAD (coronary artery disease)    Chronic kidney disease    Diabetes mellitus without complication (HCC)    Hyperlipidemia    Hypertension    Myocardial infarction (HCC)    Stroke [facility])     Past Surgical History:  Procedure Laterality Date   APPENDECTOMY     CORONARY STENT INTERVENTION N/A 06/02/2017   Procedure: CORONARY STENT INTERVENTION;  Surgeon: Antonette Batters, MD;  Location: [facility] INVASIVE CV LAB;  Service: Cardiovascular;  Laterality: N/A;   HERNIA REPAIR     LEFT HEART CATH AND CORONARY ANGIOGRAPHY N/A 06/02/2017   Procedure: LEFT HEART CATH AND CORONARY ANGIOGRAPHY;  Surgeon: Antonette Batters, MD;  Location: [facility] INVASIVE CV LAB;  Service: Cardiovascular;  Laterality: N/A;    Social History   Socioeconomic History   Marital status: Single    Spouse name: Not on file   Number of children: Not on file   Years of education: Not on file   Highest education level: Not on file  Occupational History   Not on file  Tobacco Use   Smoking status: Former    Types: Cigarettes    Start date: 2021   Smokeless tobacco: Never  Substance and Sexual Activity   Alcohol use: Yes   Drug use: No   Sexual activity: Not Currently  Other Topics Concern   Not on file  Social History Narrative   Not on file   Social Drivers of Health   Financial Resource Strain: Not on file  Food Insecurity: No Food  Insecurity (02/10/2023)   Hunger Vital Sign    Worried About Running Out of Food in the Last Year: Never true    Ran Out of Food in the Last Year: Never true  Transportation Needs: No Transportation Needs (02/10/2023)   PRAPARE - Administrator, Civil Service (Medical): No    Lack of Transportation (Non-Medical): No  Physical Activity: Not on file  Stress: Not on file  Social Connections: Not on file  Intimate Partner Violence: Not At Risk (02/10/2023)   Humiliation, Afraid, Rape, and Kick questionnaire    Fear of Current or Ex-Partner: No    Emotionally Abused: No    Physically Abused: No    Sexually Abused: No    Family History  Problem Relation Age of Onset   Hypertension Mother    Diabetes Father     Allergies  Allergen Reactions   Lisinopril Cough    Other reaction(s): OTHER     Outpatient Medications Prior to Visit  Medication Sig   aspirin  EC 81 MG tablet Take 81 mg by mouth daily.   clopidogrel  (PLAVIX ) 75 MG tablet Take 1 tablet (75 mg total) by mouth daily with breakfast.   gabapentin  (NEURONTIN ) 300 MG capsule Take 300 mg by mouth 3 (three) times daily as  needed.   glipiZIDE (GLUCOTROL) 10 MG tablet Take 10 mg by mouth daily before breakfast.   Insulin  Aspart FlexPen (NOVOLOG ) 100 UNIT/ML Inject 10-50 Units into the skin 3 (three) times daily before meals.   Insulin  Glargine (BASAGLAR  KWIKPEN) 100 UNIT/ML Inject 40-50 Units into the skin at bedtime.   JARDIANCE  25 MG TABS tablet Take 25 mg by mouth daily.   losartan -hydrochlorothiazide (HYZAAR) 100-25 MG tablet Take 1 tablet by mouth daily.   metFORMIN  (GLUCOPHAGE ) 1000 MG tablet Take 1 tablet by mouth 2 (two) times daily with a meal.   metoprolol  tartrate (LOPRESSOR ) 50 MG tablet Take 1 tablet by mouth 2 (two) times daily.   Omega-3 Fatty Acids (FISH OIL) 300 MG CAPS Take by mouth.   rosuvastatin  (CRESTOR ) 20 MG tablet Take 20 mg by mouth at bedtime.   [DISCONTINUED] venlafaxine  XR (EFFEXOR  XR) 37.5  MG 24 hr capsule Take 1 capsule (37.5 mg total) by mouth daily.   No facility-administered medications prior to visit.    Review of Systems  Constitutional: Negative.   HENT: Negative.    Eyes: Negative.   Respiratory: Negative.  Negative for shortness of breath.   Cardiovascular: Negative.  Negative for chest pain.  Gastrointestinal: Negative.  Negative for abdominal pain, constipation and diarrhea.  Genitourinary: Negative.   Musculoskeletal:  Negative for joint pain and myalgias.  Skin: Negative.   Neurological: Negative.  Negative for dizziness and headaches.  Endo/Heme/Allergies: Negative.   All other systems reviewed and are negative.      Objective:   BP 120/80   Pulse (!) 58   Ht 5\' 9"  (1.753 m)   Wt 195 lb 6.4 oz (88.6 kg)   SpO2 96%   BMI 28.86 kg/m   Vitals:   11/01/23 0913  BP: 120/80  Pulse: (!) 58  Height: 5\' 9"  (1.753 m)  Weight: 195 lb 6.4 oz (88.6 kg)  SpO2: 96%  BMI (Calculated): 28.84    Physical Exam Nursing note reviewed.  Constitutional:      Appearance: Normal appearance. He is normal weight.  HENT:     Head: Normocephalic and atraumatic.     Nose: Nose normal.     Mouth/Throat:     Mouth: Mucous membranes are moist.     Pharynx: Oropharynx is clear.  Eyes:     Extraocular Movements: Extraocular movements intact.     Conjunctiva/sclera: Conjunctivae normal.     Pupils: Pupils are equal, round, and reactive to light.  Cardiovascular:     Rate and Rhythm: Normal rate and regular rhythm.     Pulses: Normal pulses.     Heart sounds: Normal heart sounds.  Pulmonary:     Effort: Pulmonary effort is normal.     Breath sounds: Normal breath sounds.  Abdominal:     General: Abdomen is flat. Bowel sounds are normal.     Palpations: Abdomen is soft.  Musculoskeletal:        General: Normal range of motion.     Cervical back: Normal range of motion.  Skin:    General: Skin is warm and dry.  Neurological:     General: No focal deficit  present.     Mental Status: He is alert and oriented to person, place, and time.  Psychiatric:        Mood and Affect: Mood normal.        Behavior: Behavior normal.        Thought Content: Thought content normal.  Judgment: Judgment normal.      No results found for any visits on 11/01/23.  No results found for this or any previous visit (from the past 2160 hours).    Assessment & Plan:  Lab work today. Continue all medications.   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   Total time spent: 25 minutes  Google, NP  11/01/2023   This document may have been prepared by Dragon Voice Recognition software and as such may include unintentional dictation errors.

## 2023-11-02 ENCOUNTER — Ambulatory Visit: Payer: Self-pay | Admitting: Cardiology

## 2023-11-02 LAB — CMP14+EGFR
ALT: 23 IU/L (ref 0–44)
AST: 20 IU/L (ref 0–40)
Albumin: 4.6 g/dL (ref 3.8–4.9)
Alkaline Phosphatase: 85 IU/L (ref 44–121)
BUN/Creatinine Ratio: 20 (ref 9–20)
BUN: 28 mg/dL — ABNORMAL HIGH (ref 6–24)
Bilirubin Total: 0.3 mg/dL (ref 0.0–1.2)
CO2: 21 mmol/L (ref 20–29)
Calcium: 9.7 mg/dL (ref 8.7–10.2)
Chloride: 101 mmol/L (ref 96–106)
Creatinine, Ser: 1.39 mg/dL — ABNORMAL HIGH (ref 0.76–1.27)
Globulin, Total: 2.2 g/dL (ref 1.5–4.5)
Glucose: 155 mg/dL — ABNORMAL HIGH (ref 70–99)
Potassium: 4.5 mmol/L (ref 3.5–5.2)
Sodium: 139 mmol/L (ref 134–144)
Total Protein: 6.8 g/dL (ref 6.0–8.5)
eGFR: 61 mL/min/{1.73_m2} (ref >59)

## 2023-11-02 LAB — LIPID PANEL
Chol/HDL Ratio: 3.6 ratio (ref 0.0–5.0)
Cholesterol, Total: 143 mg/dL (ref 100–199)
HDL: 40 mg/dL (ref >39)
LDL Chol Calc (NIH): 65 mg/dL (ref 0–99)
Triglycerides: 236 mg/dL — ABNORMAL HIGH (ref 0–149)
VLDL Cholesterol Cal: 38 mg/dL (ref 5–40)

## 2023-11-02 LAB — HEMOGLOBIN A1C
Est. average glucose Bld gHb Est-mCnc: 174 mg/dL
Hgb A1c MFr Bld: 7.7 % — ABNORMAL HIGH (ref 4.8–5.6)

## 2023-11-02 LAB — TSH: TSH: 1.32 u[IU]/mL (ref 0.450–4.500)

## 2024-01-03 DIAGNOSIS — I10 Essential (primary) hypertension: Secondary | ICD-10-CM | POA: Diagnosis not present

## 2024-01-03 DIAGNOSIS — E113293 Type 2 diabetes mellitus with mild nonproliferative diabetic retinopathy without macular edema, bilateral: Secondary | ICD-10-CM | POA: Diagnosis not present

## 2024-01-03 DIAGNOSIS — E782 Mixed hyperlipidemia: Secondary | ICD-10-CM | POA: Diagnosis not present

## 2024-01-03 DIAGNOSIS — Z794 Long term (current) use of insulin: Secondary | ICD-10-CM | POA: Diagnosis not present

## 2024-01-03 LAB — MICROALBUMIN / CREATININE URINE RATIO: Microalb Creat Ratio: 882.4

## 2024-01-03 LAB — PROTEIN / CREATININE RATIO, URINE
Albumin, U: 713
Creatinine, Urine: 80.8

## 2024-01-10 DIAGNOSIS — E782 Mixed hyperlipidemia: Secondary | ICD-10-CM | POA: Diagnosis not present

## 2024-01-10 DIAGNOSIS — E1129 Type 2 diabetes mellitus with other diabetic kidney complication: Secondary | ICD-10-CM | POA: Diagnosis not present

## 2024-01-10 DIAGNOSIS — E1142 Type 2 diabetes mellitus with diabetic polyneuropathy: Secondary | ICD-10-CM | POA: Diagnosis not present

## 2024-01-10 DIAGNOSIS — I10 Essential (primary) hypertension: Secondary | ICD-10-CM | POA: Diagnosis not present

## 2024-01-10 DIAGNOSIS — E1159 Type 2 diabetes mellitus with other circulatory complications: Secondary | ICD-10-CM | POA: Diagnosis not present

## 2024-01-10 DIAGNOSIS — Z1331 Encounter for screening for depression: Secondary | ICD-10-CM | POA: Diagnosis not present

## 2024-01-10 DIAGNOSIS — R809 Proteinuria, unspecified: Secondary | ICD-10-CM | POA: Diagnosis not present

## 2024-01-10 DIAGNOSIS — J0110 Acute frontal sinusitis, unspecified: Secondary | ICD-10-CM | POA: Diagnosis not present

## 2024-02-07 DIAGNOSIS — I25119 Atherosclerotic heart disease of native coronary artery with unspecified angina pectoris: Secondary | ICD-10-CM | POA: Diagnosis not present

## 2024-02-07 DIAGNOSIS — E7800 Pure hypercholesterolemia, unspecified: Secondary | ICD-10-CM | POA: Diagnosis not present

## 2024-02-07 DIAGNOSIS — I10 Essential (primary) hypertension: Secondary | ICD-10-CM | POA: Diagnosis not present

## 2024-02-07 DIAGNOSIS — N189 Chronic kidney disease, unspecified: Secondary | ICD-10-CM | POA: Diagnosis not present

## 2024-02-07 DIAGNOSIS — Z955 Presence of coronary angioplasty implant and graft: Secondary | ICD-10-CM | POA: Diagnosis not present

## 2024-02-07 DIAGNOSIS — E1122 Type 2 diabetes mellitus with diabetic chronic kidney disease: Secondary | ICD-10-CM | POA: Diagnosis not present

## 2024-02-07 DIAGNOSIS — Z8673 Personal history of transient ischemic attack (TIA), and cerebral infarction without residual deficits: Secondary | ICD-10-CM | POA: Diagnosis not present

## 2024-02-08 ENCOUNTER — Other Ambulatory Visit: Payer: Self-pay | Admitting: Internal Medicine

## 2024-02-08 DIAGNOSIS — I25119 Atherosclerotic heart disease of native coronary artery with unspecified angina pectoris: Secondary | ICD-10-CM

## 2024-03-01 ENCOUNTER — Encounter: Payer: Self-pay | Admitting: Cardiology

## 2024-03-06 ENCOUNTER — Ambulatory Visit: Admitting: Cardiology

## 2024-03-06 ENCOUNTER — Encounter: Payer: Self-pay | Admitting: Cardiology

## 2024-03-06 VITALS — BP 144/68 | HR 69 | Ht 69.0 in | Wt 197.0 lb

## 2024-03-06 DIAGNOSIS — E7800 Pure hypercholesterolemia, unspecified: Secondary | ICD-10-CM | POA: Diagnosis not present

## 2024-03-06 DIAGNOSIS — E1169 Type 2 diabetes mellitus with other specified complication: Secondary | ICD-10-CM | POA: Diagnosis not present

## 2024-03-06 DIAGNOSIS — I10 Essential (primary) hypertension: Secondary | ICD-10-CM

## 2024-03-06 DIAGNOSIS — Z23 Encounter for immunization: Secondary | ICD-10-CM | POA: Diagnosis not present

## 2024-03-06 NOTE — Progress Notes (Signed)
 Established Patient Office Visit  Subjective:  Patient ID: Peter Galloway, male    DOB: 13-Jul-1970  Age: 53 y.o. MRN: [id number]  Chief Complaint  Patient presents with   Follow-up    4 months Follow up    Patient in office for 4 month follow up, discuss recent lab results. Patient doing well, no complaints today.  Blood pressure elevated today, patient reports taking his medications 30 minutes prior to visit.  Discussed recent lab work. Patient follows with endocrinology for his DM. Urine micro done at endo 12/2023. Patient requesting a flu vaccination today.  Continue same medications.     No other concerns at this time.   Past Medical History:  Diagnosis Date   CAD (coronary artery disease)    Chronic kidney disease    Diabetes mellitus without complication (HCC)    Hyperlipidemia    Hypertension    Myocardial infarction (HCC)    Stroke [facility])     Past Surgical History:  Procedure Laterality Date   APPENDECTOMY     CORONARY STENT INTERVENTION N/A 06/02/2017   Procedure: CORONARY STENT INTERVENTION;  Surgeon: Florencio Cara BIRCH, MD;  Location: [facility] INVASIVE CV LAB;  Service: Cardiovascular;  Laterality: N/A;   HERNIA REPAIR     LEFT HEART CATH AND CORONARY ANGIOGRAPHY N/A 06/02/2017   Procedure: LEFT HEART CATH AND CORONARY ANGIOGRAPHY;  Surgeon: Florencio Cara BIRCH, MD;  Location: [facility] INVASIVE CV LAB;  Service: Cardiovascular;  Laterality: N/A;    Social History   Socioeconomic History   Marital status: Single    Spouse name: Not on file   Number of children: Not on file   Years of education: Not on file   Highest education level: Not on file  Occupational History   Not on file  Tobacco Use   Smoking status: Former    Types: Cigarettes    Start date: 2021   Smokeless tobacco: Never  Substance and Sexual Activity   Alcohol use: Yes   Drug use: No   Sexual activity: Not Currently  Other Topics Concern   Not on file  Social History Narrative   Not on file    Social Drivers of Health   Financial Resource Strain: Not on file  Food Insecurity: No Food Insecurity (02/10/2023)   Hunger Vital Sign    Worried About Running Out of Food in the Last Year: Never true    Ran Out of Food in the Last Year: Never true  Transportation Needs: No Transportation Needs (02/10/2023)   PRAPARE - Administrator, Civil Service (Medical): No    Lack of Transportation (Non-Medical): No  Physical Activity: Not on file  Stress: Not on file  Social Connections: Not on file  Intimate Partner Violence: Not At Risk (02/10/2023)   Humiliation, Afraid, Rape, and Kick questionnaire    Fear of Current or Ex-Partner: No    Emotionally Abused: No    Physically Abused: No    Sexually Abused: No    Family History  Problem Relation Age of Onset   Hypertension Mother    Diabetes Father     Allergies  Allergen Reactions   Lisinopril Cough    Other reaction(s): OTHER     Outpatient Medications Prior to Visit  Medication Sig   aspirin  EC 81 MG tablet Take 81 mg by mouth daily.   clopidogrel  (PLAVIX ) 75 MG tablet Take 1 tablet (75 mg total) by mouth daily with breakfast.   gabapentin  (NEURONTIN ) 300 MG capsule  Take 300 mg by mouth 3 (three) times daily as needed.   glipiZIDE (GLUCOTROL) 10 MG tablet Take 10 mg by mouth daily before breakfast.   Insulin  Aspart FlexPen (NOVOLOG ) 100 UNIT/ML Inject 10-50 Units into the skin 3 (three) times daily before meals.   Insulin  Glargine (BASAGLAR  KWIKPEN) 100 UNIT/ML Inject 40-50 Units into the skin at bedtime.   JARDIANCE  25 MG TABS tablet Take 25 mg by mouth daily.   losartan -hydrochlorothiazide (HYZAAR) 100-25 MG tablet Take 1 tablet by mouth daily.   metFORMIN  (GLUCOPHAGE ) 1000 MG tablet Take 1 tablet by mouth 2 (two) times daily with a meal.   metoprolol  tartrate (LOPRESSOR ) 50 MG tablet Take 1 tablet by mouth 2 (two) times daily.   Omega-3 Fatty Acids (FISH OIL) 300 MG CAPS Take by mouth.   rosuvastatin   (CRESTOR ) 20 MG tablet Take 20 mg by mouth at bedtime.   venlafaxine  XR (EFFEXOR  XR) 37.5 MG 24 hr capsule Take 1 capsule (37.5 mg total) by mouth daily.   No facility-administered medications prior to visit.    Review of Systems  Constitutional: Negative.   HENT: Negative.    Eyes: Negative.   Respiratory: Negative.  Negative for shortness of breath.   Cardiovascular: Negative.  Negative for chest pain.  Gastrointestinal: Negative.  Negative for abdominal pain, constipation and diarrhea.  Genitourinary: Negative.   Musculoskeletal:  Negative for joint pain and myalgias.  Skin: Negative.   Neurological: Negative.  Negative for dizziness and headaches.  Endo/Heme/Allergies: Negative.   All other systems reviewed and are negative.      Objective:   BP (!) 144/68   Pulse 69   Ht 5' 9 (1.753 m)   Wt 197 lb (89.4 kg)   SpO2 96%   BMI 29.09 kg/m   Vitals:   03/06/24 0907  BP: (!) 144/68  Pulse: 69  Height: 5' 9 (1.753 m)  Weight: 197 lb (89.4 kg)  SpO2: 96%  BMI (Calculated): 29.08    Physical Exam Nursing note reviewed.  Constitutional:      Appearance: Normal appearance. He is normal weight.  HENT:     Head: Normocephalic and atraumatic.     Nose: Nose normal.     Mouth/Throat:     Mouth: Mucous membranes are moist.     Pharynx: Oropharynx is clear.  Eyes:     Extraocular Movements: Extraocular movements intact.     Conjunctiva/sclera: Conjunctivae normal.     Pupils: Pupils are equal, round, and reactive to light.  Cardiovascular:     Rate and Rhythm: Normal rate and regular rhythm.     Pulses: Normal pulses.     Heart sounds: Normal heart sounds.  Pulmonary:     Effort: Pulmonary effort is normal.     Breath sounds: Normal breath sounds.  Abdominal:     General: Abdomen is flat. Bowel sounds are normal.     Palpations: Abdomen is soft.  Musculoskeletal:        General: Normal range of motion.     Cervical back: Normal range of motion.  Skin:     General: Skin is warm and dry.  Neurological:     General: No focal deficit present.     Mental Status: He is alert and oriented to person, place, and time.  Psychiatric:        Mood and Affect: Mood normal.        Behavior: Behavior normal.        Thought Content: Thought content normal.  Judgment: Judgment normal.      No results found for any visits on 03/06/24.  Recent Results (from the past 2160 hours)  Microalbumin / creatinine urine ratio     Status: None   Collection Time: 01/03/24 12:00 AM  Result Value Ref Range   Microalb Creat Ratio 882.4   Protein / creatinine ratio, urine     Status: None   Collection Time: 01/03/24 12:00 AM  Result Value Ref Range   Creatinine, Urine 80.8    Albumin, U 713       Assessment & Plan:  Flu vaccination today. Continue same medications.   Problem List Items Addressed This Visit       Cardiovascular and Mediastinum   Essential hypertension - Primary     Endocrine   Type 2 diabetes mellitus (HCC)     Other   Pure hypercholesterolemia   Other Visit Diagnoses       Flu vaccine need       Relevant Orders   Influenza, MDCK, trivalent, PF(Flucelvax egg-free) (Completed)       Return in about 4 months (around 07/07/2024) for fasting labs prior.   Total time spent: 25 minutes  Google, NP  03/06/2024   This document may have been prepared by Dragon Voice Recognition software and as such may include unintentional dictation errors.

## 2024-04-13 DIAGNOSIS — J019 Acute sinusitis, unspecified: Secondary | ICD-10-CM | POA: Diagnosis not present

## 2024-04-13 DIAGNOSIS — I160 Hypertensive urgency: Secondary | ICD-10-CM | POA: Diagnosis not present

## 2024-04-13 DIAGNOSIS — J3489 Other specified disorders of nose and nasal sinuses: Secondary | ICD-10-CM | POA: Diagnosis not present

## 2024-04-13 DIAGNOSIS — I10 Essential (primary) hypertension: Secondary | ICD-10-CM | POA: Diagnosis not present

## 2024-04-17 ENCOUNTER — Encounter: Payer: Self-pay | Admitting: *Deleted

## 2024-04-17 NOTE — Progress Notes (Signed)
 Peter Galloway                                          MRN: [id number]   04/17/2024   The [facility] Quality Team Specialist reviewed this patient medical record for the purposes of chart review for care gap closure. The following were reviewed: abstraction for care gap closure-kidney health evaluation for diabetes:eGFR  and uACR.    [facility] Quality Team

## 2024-05-01 ENCOUNTER — Other Ambulatory Visit: Payer: Self-pay | Admitting: Cardiology

## 2024-05-08 DIAGNOSIS — E1142 Type 2 diabetes mellitus with diabetic polyneuropathy: Secondary | ICD-10-CM | POA: Diagnosis not present

## 2024-05-15 DIAGNOSIS — E1159 Type 2 diabetes mellitus with other circulatory complications: Secondary | ICD-10-CM | POA: Diagnosis not present

## 2024-05-15 DIAGNOSIS — E1129 Type 2 diabetes mellitus with other diabetic kidney complication: Secondary | ICD-10-CM | POA: Diagnosis not present

## 2024-05-15 DIAGNOSIS — I10 Essential (primary) hypertension: Secondary | ICD-10-CM | POA: Diagnosis not present

## 2024-05-15 DIAGNOSIS — E782 Mixed hyperlipidemia: Secondary | ICD-10-CM | POA: Diagnosis not present

## 2024-05-15 DIAGNOSIS — R809 Proteinuria, unspecified: Secondary | ICD-10-CM | POA: Diagnosis not present

## 2024-05-15 DIAGNOSIS — E1142 Type 2 diabetes mellitus with diabetic polyneuropathy: Secondary | ICD-10-CM | POA: Diagnosis not present

## 2024-06-05 ENCOUNTER — Other Ambulatory Visit: Payer: Self-pay | Admitting: Cardiology

## 2024-07-10 ENCOUNTER — Ambulatory Visit: Payer: Self-pay | Admitting: Cardiology
